# Patient Record
Sex: Female | Born: 1965 | Race: Asian | Hispanic: No | Marital: Married | Smoking: Never smoker
Health system: Southern US, Community
[De-identification: ages and names within clinical notes are randomized; demographics above are authoritative.]

## PROBLEM LIST (undated history)

## (undated) DIAGNOSIS — T7840XA Allergy, unspecified, initial encounter: Secondary | ICD-10-CM

## (undated) DIAGNOSIS — E079 Disorder of thyroid, unspecified: Secondary | ICD-10-CM

## (undated) HISTORY — PX: SMALL INTESTINE SURGERY: SHX150

## (undated) HISTORY — PX: BREAST SURGERY: SHX581

## (undated) HISTORY — DX: Disorder of thyroid, unspecified: E07.9

## (undated) HISTORY — DX: Allergy, unspecified, initial encounter: T78.40XA

## (undated) HISTORY — PX: CHOLECYSTECTOMY: SHX55

## (undated) HISTORY — PX: ABDOMINAL HYSTERECTOMY: SHX81

---

## 1999-03-31 ENCOUNTER — Other Ambulatory Visit: Admission: RE | Admit: 1999-03-31 | Discharge: 1999-03-31 | Payer: Self-pay | Admitting: Obstetrics and Gynecology

## 2012-06-15 ENCOUNTER — Other Ambulatory Visit: Payer: Self-pay | Admitting: Obstetrics and Gynecology

## 2012-06-15 DIAGNOSIS — Z803 Family history of malignant neoplasm of breast: Secondary | ICD-10-CM

## 2012-06-28 ENCOUNTER — Ambulatory Visit
Admission: RE | Admit: 2012-06-28 | Discharge: 2012-06-28 | Disposition: A | Discharge status: Home / Self Care | Payer: Managed Care, Other (non HMO) | Source: Ambulatory Visit | Attending: Obstetrics and Gynecology | Admitting: Obstetrics and Gynecology

## 2012-06-28 DIAGNOSIS — Z803 Family history of malignant neoplasm of breast: Secondary | ICD-10-CM

## 2012-06-28 MED ORDER — GADOBENATE DIMEGLUMINE 529 MG/ML IV SOLN
17.0000 mL | Freq: Once | INTRAVENOUS | Status: AC | PRN
  Administered 2012-06-28: 17 mL via INTRAVENOUS

## 2014-05-23 ENCOUNTER — Other Ambulatory Visit: Payer: Self-pay | Admitting: Obstetrics and Gynecology

## 2014-05-23 DIAGNOSIS — Z803 Family history of malignant neoplasm of breast: Secondary | ICD-10-CM

## 2014-07-15 ENCOUNTER — Other Ambulatory Visit: Payer: Managed Care, Other (non HMO)

## 2014-07-22 ENCOUNTER — Ambulatory Visit
Admission: RE | Admit: 2014-07-22 | Discharge: 2014-07-22 | Disposition: A | Discharge status: Home / Self Care | Payer: Managed Care, Other (non HMO) | Source: Ambulatory Visit | Attending: Obstetrics and Gynecology | Admitting: Obstetrics and Gynecology

## 2014-07-22 DIAGNOSIS — Z803 Family history of malignant neoplasm of breast: Secondary | ICD-10-CM

## 2014-07-22 MED ORDER — GADOBENATE DIMEGLUMINE 529 MG/ML IV SOLN
16.0000 mL | Freq: Once | INTRAVENOUS | Status: AC | PRN
  Administered 2014-07-22: 16 mL via INTRAVENOUS

## 2016-08-22 ENCOUNTER — Other Ambulatory Visit: Payer: Self-pay | Admitting: Obstetrics and Gynecology

## 2016-08-22 DIAGNOSIS — R928 Other abnormal and inconclusive findings on diagnostic imaging of breast: Secondary | ICD-10-CM

## 2016-08-30 ENCOUNTER — Ambulatory Visit
Admission: RE | Admit: 2016-08-30 | Discharge: 2016-08-30 | Disposition: A | Discharge status: Home / Self Care | Payer: Managed Care, Other (non HMO) | Source: Ambulatory Visit | Attending: Obstetrics and Gynecology | Admitting: Obstetrics and Gynecology

## 2016-08-30 ENCOUNTER — Other Ambulatory Visit: Payer: Self-pay | Admitting: Obstetrics and Gynecology

## 2016-08-30 DIAGNOSIS — Z803 Family history of malignant neoplasm of breast: Secondary | ICD-10-CM

## 2016-08-30 DIAGNOSIS — R928 Other abnormal and inconclusive findings on diagnostic imaging of breast: Secondary | ICD-10-CM

## 2016-09-07 ENCOUNTER — Other Ambulatory Visit: Payer: Managed Care, Other (non HMO)

## 2016-09-20 ENCOUNTER — Ambulatory Visit
Admission: RE | Admit: 2016-09-20 | Discharge: 2016-09-20 | Disposition: A | Discharge status: Home / Self Care | Payer: Managed Care, Other (non HMO) | Source: Ambulatory Visit | Attending: Obstetrics and Gynecology | Admitting: Obstetrics and Gynecology

## 2016-09-20 DIAGNOSIS — Z803 Family history of malignant neoplasm of breast: Secondary | ICD-10-CM

## 2016-09-20 MED ORDER — GADOBENATE DIMEGLUMINE 529 MG/ML IV SOLN
16.0000 mL | Freq: Once | INTRAVENOUS | Status: AC | PRN
  Administered 2016-09-20: 16 mL via INTRAVENOUS

## 2017-03-07 ENCOUNTER — Ambulatory Visit (HOSPITAL_BASED_OUTPATIENT_CLINIC_OR_DEPARTMENT_OTHER)
Admission: RE | Admit: 2017-03-07 | Discharge: 2017-03-07 | Disposition: A | Discharge status: Home / Self Care | Payer: Managed Care, Other (non HMO) | Source: Ambulatory Visit | Attending: Podiatry | Admitting: Podiatry

## 2017-03-07 ENCOUNTER — Encounter: Payer: Self-pay | Admitting: Podiatry

## 2017-03-07 ENCOUNTER — Ambulatory Visit (INDEPENDENT_AMBULATORY_CARE_PROVIDER_SITE_OTHER): Payer: Managed Care, Other (non HMO) | Admitting: Podiatry

## 2017-03-07 DIAGNOSIS — M67874 Other specified disorders of tendon, left ankle and foot: Secondary | ICD-10-CM | POA: Insufficient documentation

## 2017-03-07 DIAGNOSIS — M67873 Other specified disorders of tendon, right ankle and foot: Secondary | ICD-10-CM | POA: Diagnosis not present

## 2017-03-07 DIAGNOSIS — R52 Pain, unspecified: Secondary | ICD-10-CM

## 2017-03-07 DIAGNOSIS — M722 Plantar fascial fibromatosis: Secondary | ICD-10-CM | POA: Diagnosis not present

## 2017-03-07 MED ORDER — METHYLPREDNISOLONE 4 MG PO TBPK: ORAL_TABLET | ORAL | 0 refills | Status: DC

## 2017-03-07 MED ORDER — MELOXICAM 15 MG PO TABS: 15.0000 mg | ORAL_TABLET | Freq: Every day | ORAL | 2 refills | Status: AC

## 2017-03-07 NOTE — Patient Instructions (Signed)
You can start the medrol dose pack. Once this is complete you can start the mobic (meloxicam).   Stop by [employer] and look at new shoes.    Plantar Fasciitis Rehab Ask your health care provider which exercises are safe for you. Do exercises exactly as told by your health care provider and adjust them as directed. It is normal to feel mild stretching, pulling, tightness, or discomfort as you do these exercises, but you should stop right away if you feel sudden pain or your pain gets worse. Do not begin these exercises until told by your health care provider. Stretching and range of motion exercises These exercises warm up your muscles and joints and improve the movement and flexibility of your foot. These exercises also help to relieve pain. Exercise A: Plantar fascia stretch   1. Sit with your left / right leg crossed over your opposite knee. 2. Hold your heel with one hand with that thumb near your arch. With your other hand, hold your toes and gently pull them back toward the top of your foot. You should feel a stretch on the bottom of your toes or your foot or both. 3. Hold this stretch for__________ seconds. 4. Slowly release your toes and return to the starting position. Repeat __________ times. Complete this exercise __________ times a day. Exercise B: Gastroc, standing   1. Stand with your hands against a wall. 2. Extend your left / right leg behind you, and bend your front knee slightly. 3. Keeping your heels on the floor and keeping your back knee straight, shift your weight toward the wall without arching your back. You should feel a gentle stretch in your left / right calf. 4. Hold this position for __________ seconds. Repeat __________ times. Complete this exercise __________ times a day. Exercise C: Soleus, standing  1. Stand with your hands against a wall. 2. Extend your left / right leg behind you, and bend your front knee slightly. 3. Keeping your heels on the floor, bend  your back knee and slightly shift your weight over the back leg. You should feel a gentle stretch deep in your calf. 4. Hold this position for __________ seconds. Repeat __________ times. Complete this exercise __________ times a day. Exercise D: Gastrocsoleus, standing  1. Stand with the ball of your left / right foot on a step. The ball of your foot is on the walking surface, right under your toes. 2. Keep your other foot firmly on the same step. 3. Hold onto the wall or a railing for balance. 4. Slowly lift your other foot, allowing your body weight to press your heel down over the edge of the step. You should feel a stretch in your left / right calf. 5. Hold this position for __________ seconds. 6. Return both feet to the step. 7. Repeat this exercise with a slight bend in your left / right knee. Repeat __________ times with your left / right knee straight and __________ times with your left / right knee bent. Complete this exercise __________ times a day. Balance exercise This exercise builds your balance and strength control of your arch to help take pressure off your plantar fascia. Exercise E: Single leg stand  1. Without shoes, stand near a railing or in a doorway. You may hold onto the railing or door frame as needed. 2. Stand on your left / right foot. Keep your big toe down on the floor and try to keep your arch lifted. Do not let  your foot roll inward. 3. Hold this position for __________ seconds. 4. If this exercise is too easy, you can try it with your eyes closed or while standing on a pillow. Repeat __________ times. Complete this exercise __________ times a day. This information is not intended to replace advice given to you by your health care provider. Make sure you discuss any questions you have with your health care provider. Document Released: 10/24/2005 Document Revised: 06/28/2016 Document Reviewed: 09/07/2015 Elsevier Interactive Patient Education  2017 [employer].

## 2017-03-07 NOTE — Progress Notes (Signed)
   Subjective:    Patient ID: Aimee Daniels, female    DOB: [birth year], 51 y.o.   MRN: [id number]  HPI  51 year old female presents the office with concerns of bilateral heel pain which is been ongoing for about 1 year. She states that she also has fallen arches. She denies any recent injury or trauma. The pain does not wake up at night. No numbness or tingling. She is in the recent treatment for. She gets pain she's been sitting for some time she stands back up. She has no other complaints today.    Review of Systems  All other systems reviewed and are negative.      Objective:   Physical Exam General: AAO x3, NAD  Dermatological: Skin is warm, dry and supple bilateral. Nails x 10 are well manicured; remaining integument appears unremarkable at this time. There are no open sores, no preulcerative lesions, no rash or signs of infection present.  Vascular: Dorsalis Pedis artery and Posterior Tibial artery pedal pulses are 2/4 bilateral with immedate capillary fill time. Pedal hair growth present. No varicosities and no lower extremity edema present bilateral. There is no pain with calf compression, swelling, warmth, erythema.   Neruologic: Grossly intact via light touch bilateral. Vibratory intact via tuning fork bilateral. Protective threshold with Semmes Wienstein monofilament intact to all pedal sites bilateral.   Musculoskeletal: Tenderness to palpation along the plantar medial tubercle of the calcaneus at the insertion of plantar fascia on the left and right plantar foot. There is no pain along the course of the plantar fascia within the arch of the foot. Plantar fascia appears to be intact. There is no pain with lateral compression of the calcaneus or pain with vibratory sensation. There is no pain along the course or insertion of the achilles tendon. No other areas of tenderness to bilateral lower extremities. Muscular strength 5/5 in all groups tested bilateral. Decrease in medial  arch height. Equinus  Gait: Unassisted, Nonantalgic.      Assessment & Plan:  51 year old female bilateral heel pain, likely plantar fasciitis -Treatment options discussed including all alternatives, risks, and complications -Etiology of symptoms were discussed .mwxry -Declined steroid injection -Night splint -Plantar fascial braces dispensed -Long-term recommended orthotics. Also discussed shoe gear modifications. -Stretching icing daily. -RTC 3-4 weeks or sooner if needed.  Ovid Curd, DPM

## 2017-03-28 ENCOUNTER — Ambulatory Visit (INDEPENDENT_AMBULATORY_CARE_PROVIDER_SITE_OTHER): Payer: Managed Care, Other (non HMO) | Admitting: Podiatry

## 2017-03-28 DIAGNOSIS — M722 Plantar fascial fibromatosis: Secondary | ICD-10-CM | POA: Diagnosis not present

## 2017-03-28 NOTE — Progress Notes (Signed)
Subjective: Aimee Daniels presents to the office today for follow-up evaluation of bilateral heel pain, plantar fasciitis. They state that they are doing much better. She is having minimal to no pain. They have been icing, stretching, try to wear supportive shoe as much as possible. She has been wearing the plantar fascial braces. She did purchase new shoes was been helping a lot. No other complaints at this time. No acute changes since last appointment. They deny any systemic complaints such as fevers, chills, nausea, vomiting.  Objective: General: AAO x3, NAD  Dermatological: here are no open sores, no preulcerative lesions, no rash or signs of infection present.  Vascular: Dorsalis Pedis artery and Posterior Tibial artery pedal pulses are 2/4 bilateral with immedate capillary fill time. Pedal hair growth present. There is no pain with calf compression, swelling, warmth, erythema.   Neruologic: Grossly intact via light touch bilateral.   Musculoskeletal: There is minimal and much improved tenderness palpation along the plantar medial tubercle of the calcaneus at the insertion of the plantar fascia on the left and right foot. There is no pain along the course of the plantar fascia within the arch of the foot. Plantar fascia appears to be intact bilaterally. There is no pain with lateral compression of the calcaneus and there is no pain with vibratory sensation. There is no pain along the course or insertion of the Achilles tendon. There are no other areas of tenderness to bilateral lower extremities. No gross boney pedal deformities bilateral. No pain, crepitus, or limitation noted with foot and ankle range of motion bilateral. Muscular strength 5/5 in all groups tested bilateral.  Gait: Unassisted, Nonantalgic.   Assessment: Presents for follow-up evaluation for heel pain, likely plantar fasciitis which is much improved.   Plan: -Treatment options discussed including all alternatives,  risks, and complications -Continue with ice and stretching exercises on a daily basis. -She did purchase new shoes this been helping a lot. Discussed with her she can try to wean off when the brace is admitted. She gets some recurrent pain recommended inserts for her shoes. We'll start with an over-the-counter insert. -Continue supportive shoe gear. -Follow-up if symptoms are not completely resolved in the next 4-6 weeks or sooner if any problems arise. In the meantime, encouraged to call the office with any questions, concerns, change in symptoms.   Ovid CurdMatthew Wagoner, DPM

## 2017-03-28 NOTE — Patient Instructions (Signed)
Look at a Powerstep or a Superfeet insert for your shoes if needed.

## 2017-05-16 ENCOUNTER — Encounter: Payer: Self-pay | Admitting: Podiatry

## 2017-05-16 ENCOUNTER — Ambulatory Visit (INDEPENDENT_AMBULATORY_CARE_PROVIDER_SITE_OTHER): Payer: Managed Care, Other (non HMO) | Admitting: Podiatry

## 2017-05-16 ENCOUNTER — Encounter (INDEPENDENT_AMBULATORY_CARE_PROVIDER_SITE_OTHER): Payer: Self-pay

## 2017-05-16 DIAGNOSIS — M722 Plantar fascial fibromatosis: Secondary | ICD-10-CM | POA: Diagnosis not present

## 2017-05-16 MED ORDER — METHYLPREDNISOLONE 4 MG PO TBPK: ORAL_TABLET | ORAL | 0 refills | Status: DC

## 2017-05-16 MED ORDER — TRIAMCINOLONE ACETONIDE 10 MG/ML IJ SUSP: 10.0000 mg | Freq: Once | INTRAMUSCULAR | Status: AC

## 2017-05-17 NOTE — Progress Notes (Signed)
Subjective: Aimee Daniels presents to the office today for follow-up evaluation of bilateral heel pain. They state that they are doing better but she states that she is still having pain. Today she states that she would have injections to both of her feet. She's been doing more walking while taking her son in college sores as well as moving which may be aggravated her feet. They have been icing, stretching, try to wear supportive shoe as much as possible. No other complaints at this time. No acute changes since last appointment. They deny any systemic complaints such as fevers, chills, nausea, vomiting.  Objective: General: AAO x3, NAD  Dermatological: Skin is warm, dry and supple bilateral. Nails x 10 are well manicured; remaining integument appears unremarkable at this time. There are no open sores, no preulcerative lesions, no rash or signs of infection present.  Vascular: Dorsalis Pedis artery and Posterior Tibial artery pedal pulses are 2/4 bilateral with immedate capillary fill time. Pedal hair growth present. There is no pain with calf compression, swelling, warmth, erythema.   Neruologic: Grossly intact via light touch bilateral. Vibratory intact via tuning fork bilateral. Protective threshold with Semmes Wienstein monofilament intact to all pedal sites bilateral.   Musculoskeletal: There is continued tenderness palpation along the plantar medial tubercle of the calcaneus at the insertion of the plantar fascia on the left >> right foot. There is no pain along the course of the plantar fascia within the arch of the foot. Plantar fascia appears to be intact bilaterally. There is no pain with lateral compression of the calcaneus and there is no pain with vibratory sensation. There is no pain along the course or insertion of the Achilles tendon. There are no other areas of tenderness to bilateral lower extremities. No gross boney pedal deformities bilateral. No pain, crepitus, or limitation noted  with foot and ankle range of motion bilateral. Muscular strength 5/5 in all groups tested bilateral.  Gait: Unassisted, Nonantalgic.   Assessment: Presents for follow-up evaluation for heel pain, likely plantar fasciitis   Plan: -Treatment options discussed including all alternatives, risks, and complications -Patient elects to proceed with steroid injection into the left and right heel. Under sterile skin preparation, a total of 2.5cc of kenalog 10, 0.5% Marcaine plain, and 2% lidocaine plain were infiltrated into the symptomatic area without complication. A band-aid was applied. Patient tolerated the injection well without complication. Post-injection care with discussed with the patient. Discussed with the patient to ice the area over the next couple of days to help prevent a steroid flare.  -Continue plantar fascial brace. -Ice and stretching exercises on a daily basis. -Continue supportive shoe gear. Discussed orthotics. -Follow-up in 3-4 weeks or sooner if any problems arise. In the meantime, encouraged to call the office with any questions, concerns, change in symptoms.   Ovid CurdMatthew Loretta Doutt, DPM

## 2017-05-18 DIAGNOSIS — M722 Plantar fascial fibromatosis: Secondary | ICD-10-CM | POA: Insufficient documentation

## 2017-06-20 ENCOUNTER — Ambulatory Visit: Payer: Managed Care, Other (non HMO) | Admitting: Podiatry

## 2017-07-13 ENCOUNTER — Encounter: Payer: Self-pay | Admitting: Family Medicine

## 2017-07-13 ENCOUNTER — Ambulatory Visit (INDEPENDENT_AMBULATORY_CARE_PROVIDER_SITE_OTHER): Payer: Managed Care, Other (non HMO) | Admitting: Family Medicine

## 2017-07-13 VITALS — BP 110/74 | HR 86 | Temp 98.0°F | Resp 20 | Ht 63.47 in | Wt 214.2 lb

## 2017-07-13 DIAGNOSIS — H6981 Other specified disorders of Eustachian tube, right ear: Secondary | ICD-10-CM

## 2017-07-13 DIAGNOSIS — Z23 Encounter for immunization: Secondary | ICD-10-CM | POA: Diagnosis not present

## 2017-07-13 MED ORDER — MECLIZINE HCL 25 MG PO TABS: 25.0000 mg | ORAL_TABLET | Freq: Three times a day (TID) | ORAL | 0 refills | Status: DC | PRN

## 2017-07-13 MED ORDER — FLUTICASONE PROPIONATE 50 MCG/ACT NA SUSP: 2.0000 | Freq: Every day | NASAL | 6 refills | Status: AC

## 2017-07-13 NOTE — Patient Instructions (Addendum)
   IF you received an x-ray today, you will receive an invoice from [facility]. Please contact [facility] at [phone] with questions or concerns regarding your invoice.   IF you received labwork today, you will receive an invoice from [facility]. Please contact [facility] at [phone] with questions or concerns regarding your invoice.   Our billing staff will not be able to assist you with questions regarding bills from these companies.  You will be contacted with the lab results as soon as they are available. The fastest way to get your results is to activate your My Chart account. Instructions are located on the last page of this paperwork. If you have not heard from us regarding the results in 2 weeks, please contact this office.     Eustachian Tube Dysfunction The eustachian tube connects the middle ear to the back of the nose. It regulates air pressure in the middle ear by allowing air to move between the ear and nose. It also helps to drain fluid from the middle ear space. When the eustachian tube does not function properly, air pressure, fluid, or both can build up in the middle ear. Eustachian tube dysfunction can affect one or both ears. What are the causes? This condition happens when the eustachian tube becomes blocked or cannot open normally. This may result from:  Ear infections.  Colds and other upper respiratory infections.  Allergies.  Irritation, such as from cigarette smoke or acid from the stomach coming up into the esophagus (gastroesophageal reflux).  Sudden changes in air pressure, such as from descending in an airplane.  Abnormal growths in the nose or throat, such as nasal polyps, tumors, or enlarged tissue at the back of the throat (adenoids).  What increases the risk? This condition may be more likely to develop in people who smoke and people who are overweight. Eustachian tube dysfunction may also be more likely to develop in  children, especially children who have:  Certain birth defects of the mouth, such as cleft palate.  Large tonsils and adenoids.  What are the signs or symptoms? Symptoms of this condition may include:  A feeling of fullness in the ear.  Ear pain.  Clicking or popping noises in the ear.  Ringing in the ear.  Hearing loss.  Loss of balance.  Symptoms may get worse when the air pressure around you changes, such as when you travel to an area of high elevation or fly on an airplane. How is this diagnosed? This condition may be diagnosed based on:  Your symptoms.  A physical exam of your ear, nose, and throat.  Tests, such as those that measure: ? The movement of your eardrum (tympanogram). ? Your hearing (audiometry).  How is this treated? Treatment depends on the cause and severity of your condition. If your symptoms are mild, you may be able to relieve your symptoms by moving air into ("popping") your ears. If you have symptoms of fluid in your ears, treatment may include:  Decongestants.  Antihistamines.  Nasal sprays or ear drops that contain medicines that reduce swelling (steroids).  In some cases, you may need to have a procedure to drain the fluid in your eardrum (myringotomy). In this procedure, a small tube is placed in the eardrum to:  Drain the fluid.  Restore the air in the middle ear space.  Follow these instructions at home:  Take over-the-counter and prescription medicines only as told by your health care provider.  Use techniques to help pop   your ears as recommended by your health care provider. These may include: ? Chewing gum. ? Yawning. ? Frequent, forceful swallowing. ? Closing your mouth, holding your nose closed, and gently blowing as if you are trying to blow air out of your nose.  Do not do any of the following until your health care provider approves: ? Travel to high altitudes. ? Fly in airplanes. ? Work in a pressurized cabin or  room. ? Scuba dive.  Keep your ears dry. Dry your ears completely after showering or bathing.  Do not smoke.  Keep all follow-up visits as told by your health care provider. This is important. Contact a health care provider if:  Your symptoms do not go away after treatment.  Your symptoms come back after treatment.  You are unable to pop your ears.  You have: ? A fever. ? Pain in your ear. ? Pain in your head or neck. ? Fluid draining from your ear.  Your hearing suddenly changes.  You become very dizzy.  You lose your balance. This information is not intended to replace advice given to you by your health care provider. Make sure you discuss any questions you have with your health care provider. Document Released: 11/20/2015 Document Revised: 03/31/2016 Document Reviewed: 11/12/2014 Elsevier Interactive Patient Education  2018 [employer]  

## 2017-07-13 NOTE — Progress Notes (Signed)
9/6/20185:41 PM  Aimee Daniels 05/29/1966, 51 y.o. female [id number]  Chief Complaint  Patient presents with  . Dizziness    X 1 week  . Ear Fullness    X 1 day    HPI:   Patient is a 51 y.o. female who presents today for about a week of progressing sense of imbalance, specially when she stands up or moves head too much. Associated with right ear fullness. Her husband states that she has been snoring this past week. She does not feel congested though endorses mild PND and tickle in back of throat. She has never had anything similar. She has been using cough drops an chewing gum. She denies any recurring ear or sinus infections/surgeries.  Depression screen PHQ 2/9 07/13/2017  Decreased Interest 0  Down, Depressed, Hopeless 0  PHQ - 2 Score 0    No Known Allergies  Current Meds  Medication Sig  . Cholecalciferol (VITAMIN D3 PO) Take by mouth.  . levothyroxine (SYNTHROID, LEVOTHROID) 137 MCG tablet Take by mouth daily before breakfast.   . liothyronine (CYTOMEL) 5 MCG tablet Take 5 mcg by mouth daily.  . meloxicam (MOBIC) 15 MG tablet Take 1 tablet (15 mg total) by mouth daily.  . temazepam (RESTORIL) 30 MG capsule TAKE ONE CAPSULE BY MOUTH ONCE DAILY AT BEDTIME AS NEEDED FOR SLEEP  . zolpidem (AMBIEN) 10 MG tablet TAKE ONE TABLET BY MOUTH ONCE DAILY AT BEDTIME AS NEEDED FOR SLEEP   Current Facility-Administered Medications for the 07/13/17 encounter (Office Visit) with Aimee LippsSantiago, Aimee Daniels, Aimee Daniels  Medication  . triamcinolone acetonide (KENALOG) 10 MG/ML injection 10 mg  kenalog in error, given by another provider in 05/2017  Past Medical History:  Diagnosis Date  . Allergy   . Thyroid disease     Past Surgical History:  Procedure Laterality Date  . ABDOMINAL HYSTERECTOMY    . BREAST SURGERY    . CESAREAN SECTION    . CHOLECYSTECTOMY      Social History  Substance Use Topics  . Smoking status: Never Smoker  . Smokeless tobacco: Never Used  . Alcohol use Yes   Comment: occ    Family History  Problem Relation Age of Onset  . Diabetes Mother   . Hyperlipidemia Mother   . Hypertension Father     Review of Systems  Constitutional: Negative for chills and fever.  HENT: Negative for congestion, ear discharge, ear pain, hearing loss, sinus pain, sore throat and tinnitus.   Respiratory: Negative for cough and shortness of breath.   Gastrointestinal: Positive for nausea. Negative for vomiting.  Neurological: Positive for dizziness. Negative for sensory change, speech change, focal weakness and headaches.     OBJECTIVE:  Blood pressure 110/74, pulse 86, temperature 98 F (36.7 C), temperature source Oral, resp. rate 20, height 5' 3.47" (1.612 Daniels), weight 214 lb 3.2 oz (97.2 kg), SpO2 96 %.  Physical Exam  Constitutional: She is oriented to person, place, and time and well-developed, well-nourished, and in no distress.  HENT:  Head: Normocephalic and atraumatic.  Right Ear: Hearing, tympanic membrane, external ear and ear canal normal.  Left Ear: Hearing, tympanic membrane, external ear and ear canal normal.  Nose: Mucosal edema present.  Mouth/Throat: Oropharynx is clear and moist.  Eyes: Pupils are equal, round, and reactive to light. EOM are normal.  Neck: Neck supple.  Cardiovascular: Normal rate, regular rhythm and normal heart sounds.  Exam reveals no gallop and no friction rub.   No murmur heard.  Pulmonary/Chest: Effort normal and breath sounds normal. She has no wheezes. She has no rales.  Lymphadenopathy:    She has no cervical adenopathy.  Neurological: She is alert and oriented to person, place, and time. She displays no weakness and normal speech. No cranial nerve deficit or sensory deficit. Gait normal.  Skin: Skin is warm and dry.    ASSESSMENT and PLAN:  1. Eustachian tube dysfunction, right Discussed diagnosis and treatment recommendations, Rx flonase and meclizine. Patient educational handout given. RTC precautions  given.  2. Need for immunization against influenza Given today - Flu Vaccine QUAD 36+ mos IM       Aimee Daniels, [facility] [address] [place], [place] [address] Ph.  [phone] Fax [phone]

## 2017-11-13 ENCOUNTER — Encounter: Payer: Self-pay | Admitting: Podiatry

## 2017-11-13 ENCOUNTER — Ambulatory Visit (INDEPENDENT_AMBULATORY_CARE_PROVIDER_SITE_OTHER): Payer: Managed Care, Other (non HMO) | Admitting: Podiatry

## 2017-11-13 DIAGNOSIS — M722 Plantar fascial fibromatosis: Secondary | ICD-10-CM | POA: Diagnosis not present

## 2017-11-13 MED ORDER — MELOXICAM 15 MG PO TABS: 15.0000 mg | ORAL_TABLET | Freq: Every day | ORAL | 0 refills | Status: AC

## 2017-11-13 NOTE — Patient Instructions (Signed)

## 2017-11-14 NOTE — Progress Notes (Signed)
Subjective: 52 year old female presents the office today for concerns and recurrent pain to bilateral heel pain, plantar fasciitis.  She presents with her husband.  She states that after the injection she was doing much better however over the last couple weeks she started to notice some recurrence.  She has been using a plantar fascial braces as well as she has purchased some good over-the-counter inserts which have been area she also has been stretching icing as much as she can.  She is also starting a new job since I last saw her she is doing more walking.  Denies any recent injury or trauma.  The pain does not wake her up at night.  She has no new concerns. Denies any systemic complaints such as fevers, chills, nausea, vomiting. No acute changes since last appointment, and no other complaints at this time.   Objective: AAO x3, NAD DP/PT pulses palpable bilaterally, CRT less than 3 seconds There is improved but still mild tenderness to palpation along the plantar medial tubercle of the calcaneus at the insertion of plantar fascia on the left and right foot. There is no pain along the course of the plantar fascia within the arch of the foot. Plantar fascia appears to be intact. There is no pain with lateral compression of the calcaneus or pain with vibratory sensation. There is no pain along the course or insertion of the achilles tendon. No other areas of tenderness to bilateral lower extremities. Negative tinel sign bilaterally.  No open lesions or pre-ulcerative lesions.  No pain with calf compression, swelling, warmth, erythema  Assessment: Bilateral heel pain, likely plantar fasciitis right  Plan: -All treatment options discussed with the patient including all alternatives, risks, complications.  -At today's appointment she is requesting steroid injections to bilateral heels.  See procedure note below.  She tolerated this well without any complications. -Continue with stretching, icing  exercises daily. -The inserts are fitting well and I want her to continue with this as well as the plantar fascial braces. -Discussed other treatment options with her today including physical therapy, EPAT but wishes to hold off on this today.  -RTC 3-4 weeks or sooner if needed. Agrees to this plan and has no further questions.  -Patient encouraged to call the office with any questions, concerns, change in symptoms.   Procedure: Injection Tendon/Ligament Discussed alternatives, risks, complications and verbal consent was obtained.  Location: Bilateral plantar fascia at the glabrous junction; medial approach. Skin Prep: Alcohol. Injectate: 1 cc 0.5% marcaine plain, 1 cc 0.5% Marcaine plain and, 1 cc kenalog 10. Disposition: Patient tolerated procedure well. Injection site dressed with a band-aid.  Post-injection care was discussed and return precautions discussed.    Vivi BarrackMatthew R Jelisa Kerr DPM

## 2017-12-04 ENCOUNTER — Ambulatory Visit: Payer: Managed Care, Other (non HMO) | Admitting: Podiatry

## 2018-08-28 ENCOUNTER — Other Ambulatory Visit: Payer: Self-pay

## 2018-08-28 ENCOUNTER — Ambulatory Visit (INDEPENDENT_AMBULATORY_CARE_PROVIDER_SITE_OTHER): Payer: Managed Care, Other (non HMO) | Admitting: Physician Assistant

## 2018-08-28 ENCOUNTER — Encounter: Payer: Self-pay | Admitting: Physician Assistant

## 2018-08-28 VITALS — BP 126/85 | HR 71 | Temp 98.9°F | Resp 16 | Ht 62.0 in | Wt 223.2 lb

## 2018-08-28 DIAGNOSIS — H608X3 Other otitis externa, bilateral: Secondary | ICD-10-CM

## 2018-08-28 DIAGNOSIS — J302 Other seasonal allergic rhinitis: Secondary | ICD-10-CM

## 2018-08-28 MED ORDER — HYDROCORTISONE-ACETIC ACID 1-2 % OT SOLN: 3.0000 [drp] | Freq: Two times a day (BID) | OTIC | 0 refills | Status: AC

## 2018-08-28 NOTE — Progress Notes (Signed)
Aimee Daniels  MRN: [id number] DOB: 06/01/1966  PCP: Marvis Repress, MD  Subjective:  Pt is a 52 year old female who presents to clinic for b/l ear pain x 1 week.  Pain R>L. Feel clogged.  She used her son's ear drops - didn't help much.  Denies balance problems, ringing in ears.   Review of Systems  Constitutional: Negative for chills, diaphoresis, fatigue and fever.  HENT: Positive for ear pain. Negative for congestion, postnasal drip, rhinorrhea, sinus pressure, sinus pain and sore throat.   Respiratory: Negative for cough, shortness of breath and wheezing.   Psychiatric/Behavioral: Negative for sleep disturbance.   Patient Active Problem List   Diagnosis Date Noted  . Plantar fasciitis 05/18/2017    Current Outpatient Medications on File Prior to Visit  Medication Sig Dispense Refill  . Cholecalciferol (VITAMIN D3 PO) Take by mouth.    . fluticasone (FLONASE) 50 MCG/ACT nasal spray Place 2 sprays into both nostrils daily. 16 g 6  . levothyroxine (SYNTHROID, LEVOTHROID) 137 MCG tablet Take by mouth daily before breakfast.     . liothyronine (CYTOMEL) 5 MCG tablet Take 5 mcg by mouth daily.    . meclizine (ANTIVERT) 25 MG tablet Take 1 tablet (25 mg total) by mouth 3 (three) times daily as needed for dizziness. 30 tablet 0  . meloxicam (MOBIC) 15 MG tablet Take 1 tablet (15 mg total) by mouth daily. 30 tablet 0  . temazepam (RESTORIL) 30 MG capsule TAKE ONE CAPSULE BY MOUTH ONCE DAILY AT BEDTIME AS NEEDED FOR SLEEP    . zolpidem (AMBIEN) 10 MG tablet TAKE ONE TABLET BY MOUTH ONCE DAILY AT BEDTIME AS NEEDED FOR SLEEP     Current Facility-Administered Medications on File Prior to Visit  Medication Dose Route Frequency Provider Last Rate Last Dose  . triamcinolone acetonide (KENALOG) 10 MG/ML injection 10 mg  10 mg Other Once Vivi Barrack, DPM        No Known Allergies   Objective:  BP 126/85 (BP Location: Right Arm, Patient Position: Sitting, Cuff Size:  Large)   Pulse 71   Temp 98.9 F (37.2 C) (Oral)   Resp 16   Ht 5\' 2"  (1.575 m)   Wt 223 lb 3.2 oz (101.2 kg)   SpO2 100%   BMI 40.82 kg/m   Physical Exam  Constitutional: She is oriented to person, place, and time. No distress.  HENT:  Right Ear: Hearing, tympanic membrane and ear canal normal. Tympanic membrane is not erythematous.  Left Ear: Hearing, tympanic membrane and ear canal normal. Tympanic membrane is not erythematous.  Nose: Mucosal edema present. No rhinorrhea. Right sinus exhibits no maxillary sinus tenderness and no frontal sinus tenderness. Left sinus exhibits no maxillary sinus tenderness and no frontal sinus tenderness.  Mouth/Throat: Oropharynx is clear and moist and mucous membranes are normal.  Cardiovascular: Normal rate, regular rhythm and normal heart sounds.  Neurological: She is alert and oriented to person, place, and time.  Skin: Skin is warm and dry.  Psychiatric: Judgment normal.  Vitals reviewed.   Assessment and Plan :  1. Noninfectious otitis externa of both ears, unspecified chronicity, unspecified type 2. Seasonal allergies - Pt presents c/o external ear pain and itching. PE is unremarkable. No red flags. Plan to treat for mild external otitis possibly 2/2 allergies. Start flonase daily.  RTC if no improvement.  - acetic acid-hydrocortisone (VOSOL-HC) OTIC solution; Place 3 drops into both ears 2 (two) times daily for 5 days.  Dispense: 10 mL; Refill: 0   Whitney Starsky Nanna, PA-C  Primary Care at [facility] 08/28/2018 4:37 PM  Please note: Portions of this report may have been transcribed using dragon voice recognition software. Every effort was made to ensure accuracy; however, inadvertent computerized transcription errors may be present.

## 2018-08-28 NOTE — Patient Instructions (Addendum)
   Start using flonase 2 sprays each nostril daily.   Apply 3-5 drops into your ears for the next 5 days.   Stay well hydrated.   Try advil if you need pain relief.   If you have lab work done today you will be contacted with your lab results within the next 2 weeks.  If you have not heard from [place] then please contact us. The fastest way to get your results is to register for My Chart.  Thank you for coming in today. I hope you feel we met your needs.  Feel free to call PCP if you have any questions or further requests.  Please consider signing up for MyChart if you do not already have it, as this is a great way to communicate with me.  Best,  Whitney McVey, PA-C  IF you received an x-ray today, you will receive an invoice from [facility]. Please contact [facility] at [phone] with questions or concerns regarding your invoice.   IF you received labwork today, you will receive an invoice from [facility]. Please contact [facility] at [phone] with questions or concerns regarding your invoice.   Our billing staff will not be able to assist you with questions regarding bills from these companies.  You will be contacted with the lab results as soon as they are available. The fastest way to get your results is to activate your My Chart account. Instructions are located on the last page of this paperwork. If you have not heard from [place] regarding the results in 2 weeks, please contact this office.

## 2018-08-29 ENCOUNTER — Other Ambulatory Visit: Payer: Self-pay | Admitting: Physician Assistant

## 2018-08-29 DIAGNOSIS — H608X3 Other otitis externa, bilateral: Secondary | ICD-10-CM

## 2018-08-29 MED ORDER — ACETIC ACID 2 % OT SOLN: 4.0000 [drp] | OTIC | 0 refills | Status: AC

## 2018-08-30 ENCOUNTER — Telehealth: Payer: Self-pay | Admitting: Physician Assistant

## 2018-08-30 NOTE — Telephone Encounter (Signed)
Copied from CRM [id number]. Topic: General - Other >> Aug 29, 2018  9:34 AM Tamela Oddi wrote: Reason for CRM: Patient called to inform the doctor that the prescription for acetic acid-hydrocortisone for her ears, is no longer available according to the pharmacy.  Patient would like an alternative medication sent into the pharmacy as soon as possible for her pain.  Please advise.  CB# [phone] >> Aug 29, 2018  3:51 PM Gerrianne Scale wrote: Pt calling back about a RX for ear drops that no longer available she would like a call back today  She uses the [facility] in [place] her number is [phone] pt states that her ear is really hurting and if someone could fill this today

## 2018-08-30 NOTE — Telephone Encounter (Signed)
Routing to PA McVey as she is the one who saw the patient for this.

## 2018-08-30 NOTE — Telephone Encounter (Signed)
Spoke to pharmacist at [facility] and [facility] No acetic acid drops available any longer. Per both pharmacists The only treatment without ABX is the OTC drops for swimmers' ear.  Discussed with McVey.  She will call pt.

## 2018-09-18 MED FILL — SERTRALINE HCL 50 MG TABLET: 50 | 30 days supply | Qty: 30 | Fill #0

## 2018-10-01 MED FILL — LIOTHYRONINE SODIUM 5 MCG T: 5 | 30 days supply | Qty: 30 | Fill #0

## 2018-10-01 MED FILL — SYNTHROID 150 MCG TABLET: 150 | 30 days supply | Qty: 30 | Fill #0

## 2018-10-16 MED FILL — MELOXICAM 15 MG TABLET: 15 | 30 days supply | Qty: 30 | Fill #0

## 2018-10-16 MED FILL — ZOLPIDEM TARTRATE 10 MG TAB: 10 | 30 days supply | Qty: 30 | Fill #0

## 2018-10-16 MED FILL — TEMAZEPAM 15 MG CAPSULE: 15 | 30 days supply | Qty: 60 | Fill #0

## 2018-11-14 MED FILL — MELOXICAM 15 MG TABLET: 15 | 30 days supply | Qty: 30 | Fill #1

## 2018-11-15 MED FILL — TEMAZEPAM 15 MG CAPSULE: 15 | 30 days supply | Qty: 60 | Fill #1

## 2018-11-15 MED FILL — ZOLPIDEM TARTRATE 10 MG TAB: 10 | 30 days supply | Qty: 30 | Fill #1

## 2018-11-20 MED FILL — GABAPENTIN 300 MG CAPSULE: 300 | 6 days supply | Qty: 18 | Fill #0

## 2018-11-20 MED FILL — METHYLPREDNISOLONE 4 MG TAB: 4 | 6 days supply | Qty: 21 | Fill #0

## 2018-11-28 MED FILL — SYNTHROID 150 MCG TABLET: 150 | 30 days supply | Qty: 30 | Fill #1

## 2018-11-28 MED FILL — LIOTHYRONINE SODIUM 5 MCG T: 5 | 30 days supply | Qty: 30 | Fill #1

## 2018-12-18 MED FILL — TEMAZEPAM 15 MG CAPSULE: 15 | 30 days supply | Qty: 60 | Fill #2

## 2018-12-18 MED FILL — ZOLPIDEM TARTRATE 10 MG TAB: 10 | 30 days supply | Qty: 30 | Fill #2

## 2019-01-02 MED FILL — LIOTHYRONINE SODIUM 5 MCG T: 5 | 30 days supply | Qty: 30 | Fill #2

## 2019-01-02 MED FILL — SYNTHROID 150 MCG TABLET: 150 | 30 days supply | Qty: 30 | Fill #2

## 2019-01-02 MED FILL — MELOXICAM 15 MG TABLET: 15 | 30 days supply | Qty: 30 | Fill #2

## 2019-01-11 MED FILL — VENLAFAXINE HCL ER 37.5 MG: 37.5 | 30 days supply | Qty: 60 | Fill #0

## 2019-01-14 MED FILL — ZOLPIDEM TARTRATE 10 MG TAB: 10 | 30 days supply | Qty: 30 | Fill #3

## 2019-01-14 MED FILL — TEMAZEPAM 15 MG CAPSULE: 15 | 30 days supply | Qty: 60 | Fill #3

## 2019-01-28 MED FILL — LIOTHYRONINE SODIUM 5 MCG T: 5 | 30 days supply | Qty: 30 | Fill #3

## 2019-01-28 MED FILL — MELOXICAM 15 MG TABLET: 15 | 30 days supply | Qty: 30 | Fill #3

## 2019-01-28 MED FILL — SYNTHROID 150 MCG TABLET: 150 | 30 days supply | Qty: 30 | Fill #3

## 2019-02-14 MED FILL — TEMAZEPAM 30 MG CAPSULE: 30 | 30 days supply | Qty: 30 | Fill #0

## 2019-02-14 MED FILL — ZOLPIDEM TARTRATE 10 MG TAB: 10 | 30 days supply | Qty: 30 | Fill #0

## 2019-03-08 MED FILL — VENLAFAXINE HCL ER 37.5 MG: 37.5 | 30 days supply | Qty: 30 | Fill #0

## 2019-03-13 MED FILL — SYNTHROID 150 MCG TABLET: 150 | 30 days supply | Qty: 30 | Fill #4

## 2019-03-13 MED FILL — ZOLPIDEM TARTRATE 10 MG TAB: 10 | 30 days supply | Qty: 30 | Fill #1

## 2019-03-13 MED FILL — LIOTHYRONINE SODIUM 5 MCG T: 5 | 30 days supply | Qty: 30 | Fill #4

## 2019-03-20 MED FILL — TEMAZEPAM 30 MG CAPSULE: 30 | 30 days supply | Qty: 30 | Fill #0

## 2019-04-17 MED FILL — ZOLPIDEM TARTRATE 10 MG TAB: 10 | 30 days supply | Qty: 30 | Fill #0

## 2019-04-17 MED FILL — TEMAZEPAM 30 MG CAPSULE: 30 | 30 days supply | Qty: 30 | Fill #1

## 2019-05-09 MED FILL — SYNTHROID 150 MCG TABLET: 150 | 30 days supply | Qty: 30 | Fill #5

## 2019-05-09 MED FILL — LIOTHYRONINE SODIUM 5 MCG T: 5 | 30 days supply | Qty: 30 | Fill #5

## 2019-05-17 MED FILL — TEMAZEPAM 30 MG CAPSULE: 30 | 60 days supply | Qty: 30 | Fill #0

## 2019-05-17 MED FILL — ZOLPIDEM TARTRATE 10 MG TAB: 10 | 30 days supply | Qty: 30 | Fill #0

## 2019-06-14 MED FILL — SYNTHROID 150 MCG TABLET: 150 | 30 days supply | Qty: 30 | Fill #6

## 2019-06-14 MED FILL — LIOTHYRONINE SODIUM 5 MCG T: 5 | 30 days supply | Qty: 30 | Fill #6

## 2019-06-15 MED FILL — ZOLPIDEM TARTRATE 10 MG TAB: 10 | 30 days supply | Qty: 30 | Fill #1

## 2019-06-15 MED FILL — TEMAZEPAM 30 MG CAPSULE: 30 | 30 days supply | Qty: 30 | Fill #1

## 2019-07-17 MED FILL — LIOTHYRONINE SODIUM 5 MCG T: 5 | 30 days supply | Qty: 30 | Fill #0

## 2019-07-17 MED FILL — SYNTHROID 150 MCG TABLET: 150 | 30 days supply | Qty: 30 | Fill #0

## 2019-07-17 MED FILL — ZOLPIDEM TARTRATE 10 MG TAB: 10 | 30 days supply | Qty: 30 | Fill #2

## 2019-07-17 MED FILL — TEMAZEPAM 30 MG CAPSULE: 30 | 30 days supply | Qty: 30 | Fill #2

## 2019-07-31 MED FILL — SHINGRIX 50 MCG SUS: 50 | 1 days supply | Qty: 1 | Fill #0

## 2019-08-09 MED FILL — INTRAROSA 6.5 MG VAG INSERT: 6.5 | 28 days supply | Qty: 28 | Fill #0

## 2019-08-14 MED FILL — LIOTHYRONINE SODIUM 5 MCG T: 5 | 30 days supply | Qty: 30 | Fill #1

## 2019-08-14 MED FILL — SYNTHROID 150 MCG TABLET: 150 | 30 days supply | Qty: 30 | Fill #1

## 2019-08-14 MED FILL — ZOLPIDEM TARTRATE 10 MG TAB: 10 | 30 days supply | Qty: 30 | Fill #0

## 2019-08-14 MED FILL — TEMAZEPAM 30 MG CAPSULE: 30 | 30 days supply | Qty: 30 | Fill #0

## 2019-09-13 MED FILL — ZOLPIDEM TARTRATE 10 MG TAB: 10 | 30 days supply | Qty: 30 | Fill #1

## 2019-09-13 MED FILL — SYNTHROID 150 MCG TABLET: 150 | 30 days supply | Qty: 30 | Fill #2

## 2019-09-13 MED FILL — TEMAZEPAM 30 MG CAPSULE: 30 | 30 days supply | Qty: 30 | Fill #1

## 2019-09-13 MED FILL — LIOTHYRONINE SODIUM 5 MCG T: 5 | 30 days supply | Qty: 30 | Fill #2

## 2019-10-16 MED FILL — TEMAZEPAM 30 MG CAPSULE: 30 | 30 days supply | Qty: 30 | Fill #2

## 2019-10-16 MED FILL — LIOTHYRONINE SODIUM 5 MCG T: 5 | 30 days supply | Qty: 30 | Fill #3

## 2019-10-16 MED FILL — ZOLPIDEM TARTRATE 10 MG TAB: 10 | 30 days supply | Qty: 30 | Fill #2

## 2019-10-16 MED FILL — SYNTHROID 150 MCG TABLET: 150 | 30 days supply | Qty: 30 | Fill #3

## 2019-11-12 MED FILL — LIOTHYRONINE SODIUM 5 MCG T: 5 | 30 days supply | Qty: 30 | Fill #4

## 2019-11-12 MED FILL — SYNTHROID 150 MCG TABLET: 150 | 30 days supply | Qty: 30 | Fill #4

## 2019-11-13 MED FILL — ZOLPIDEM TARTRATE 10 MG TAB: 10 | 30 days supply | Qty: 30 | Fill #0

## 2019-11-14 MED FILL — TEMAZEPAM 30 MG CAPSULE: 30 | 30 days supply | Qty: 30 | Fill #0

## 2019-12-16 MED FILL — SYNTHROID 150 MCG TABLET: 150 | 30 days supply | Qty: 30 | Fill #5

## 2019-12-16 MED FILL — TEMAZEPAM 30 MG CAPSULE: 30 | 30 days supply | Qty: 30 | Fill #1

## 2019-12-16 MED FILL — ZOLPIDEM TARTRATE 10 MG TAB: 10 | 30 days supply | Qty: 30 | Fill #1

## 2019-12-16 MED FILL — LIOTHYRONINE SODIUM 5 MCG T: 5 | 30 days supply | Qty: 30 | Fill #5

## 2020-01-14 MED FILL — SYNTHROID 150 MCG TABLET: 150 | 30 days supply | Qty: 30 | Fill #6

## 2020-01-14 MED FILL — LIOTHYRONINE SODIUM 5 MCG T: 5 | 30 days supply | Qty: 30 | Fill #6

## 2020-01-14 MED FILL — ZOLPIDEM TARTRATE 10 MG TAB: 10 | 30 days supply | Qty: 30 | Fill #2

## 2020-01-14 MED FILL — TEMAZEPAM 30 MG CAPSULE: 30 | 30 days supply | Qty: 30 | Fill #2

## 2020-01-29 MED FILL — SYNTHROID 175 MCG TABLET: 175 | 30 days supply | Qty: 30 | Fill #0

## 2020-01-31 MED FILL — PEG-3350 SOLUTION: 420 | 1 days supply | Qty: 4000 | Fill #0

## 2020-02-13 ENCOUNTER — Other Ambulatory Visit (HOSPITAL_COMMUNITY): Payer: Self-pay | Admitting: Internal Medicine

## 2020-02-13 MED FILL — TEMAZEPAM 30 MG CAPSULE: 30 | 30 days supply | Qty: 30 | Fill #0

## 2020-02-13 MED FILL — ZOLPIDEM TARTRATE 10 MG TAB: 10 | 30 days supply | Qty: 30 | Fill #0

## 2020-02-13 MED FILL — LIOTHYRONINE SODIUM 5 MCG T: 5 | 30 days supply | Qty: 30 | Fill #0

## 2020-02-25 MED FILL — SYNTHROID 175 MCG TABLET: 175 | 30 days supply | Qty: 30 | Fill #1

## 2020-03-26 MED FILL — SYNTHROID 175 MCG TABLET: 175 | 30 days supply | Qty: 30 | Fill #2

## 2020-04-08 MED FILL — ACETAMINOPHEN/COD #3 TABLET: 300-30 | 3 days supply | Qty: 20 | Fill #0

## 2020-04-21 MED FILL — ZOLPIDEM TARTRATE 10 MG TAB: 10 | 30 days supply | Qty: 30 | Fill #0

## 2020-04-21 MED FILL — TEMAZEPAM 30 MG CAPSULE: 30 | 30 days supply | Qty: 30 | Fill #0

## 2020-05-05 MED FILL — TEMAZEPAM 30 MG CAPSULE: 30 | 30 days supply | Qty: 30 | Fill #2

## 2020-05-05 MED FILL — ZOLPIDEM TARTRATE 10 MG TAB: 10 | 30 days supply | Qty: 30 | Fill #2

## 2020-05-05 MED FILL — tiZANidine HCL 2 MG TABS: 2 | 6 days supply | Qty: 20 | Fill #0

## 2020-05-05 MED FILL — LIOTHYRONINE SODIUM 5 MCG T: 5 | 30 days supply | Qty: 30 | Fill #1

## 2020-05-05 MED FILL — SYNTHROID 175 MCG TABLET: 175 | 30 days supply | Qty: 30 | Fill #3

## 2020-05-05 MED FILL — HYDROCODON-APAP 5-325: 5-325 | 5 days supply | Qty: 30 | Fill #0

## 2020-05-27 MED FILL — tiZANidine HCL 2 MG TABS: 2 | 6 days supply | Qty: 20 | Fill #0

## 2020-06-08 MED FILL — LIOTHYRONINE SODIUM 5 MCG T: 5 | 30 days supply | Qty: 30 | Fill #2

## 2020-06-08 MED FILL — ZOLPIDEM TARTRATE 10 MG TAB: 10 | 30 days supply | Qty: 30 | Fill #0

## 2020-06-08 MED FILL — TEMAZEPAM 30 MG CAPSULE: 30 | 30 days supply | Qty: 30 | Fill #0

## 2020-06-08 MED FILL — SYNTHROID 175 MCG TABLET: 175 | 30 days supply | Qty: 30 | Fill #4

## 2020-07-16 MED FILL — LIOTHYRONINE SODIUM 5 MCG T: 5 | 30 days supply | Qty: 30 | Fill #3

## 2020-07-16 MED FILL — SYNTHROID 175 MCG TABLET: 175 | 30 days supply | Qty: 30 | Fill #5

## 2020-07-16 MED FILL — ZOLPIDEM TARTRATE 10 MG TAB: 10 | 30 days supply | Qty: 30 | Fill #1

## 2020-07-16 MED FILL — TEMAZEPAM 30 MG CAPSULE: 30 | 30 days supply | Qty: 30 | Fill #1

## 2020-08-19 ENCOUNTER — Other Ambulatory Visit (HOSPITAL_COMMUNITY): Payer: Self-pay | Admitting: Internal Medicine

## 2020-08-19 MED FILL — LIOTHYRONINE SODIUM 5 MCG T: 5 | 30 days supply | Qty: 30 | Fill #4

## 2020-08-19 MED FILL — ZOLPIDEM TARTRATE 10 MG TAB: 10 | 30 days supply | Qty: 30 | Fill #2

## 2020-08-19 MED FILL — TEMAZEPAM 30 MG CAPSULE: 30 | 30 days supply | Qty: 30 | Fill #2

## 2020-08-19 MED FILL — SYNTHROID 175 MCG TABLET: 175 | 30 days supply | Qty: 30 | Fill #0

## 2020-09-09 MED FILL — SYNTHROID 100 MCG TABLET: 100 | 30 days supply | Qty: 30 | Fill #0

## 2020-09-14 MED FILL — LIOTHYRONINE SODIUM 5 MCG T: 5 | 30 days supply | Qty: 30 | Fill #5

## 2020-09-16 ENCOUNTER — Other Ambulatory Visit (HOSPITAL_COMMUNITY): Payer: Self-pay | Admitting: Family Medicine

## 2020-09-16 MED FILL — traZODone HCL 50 MG TABS: 50 | 30 days supply | Qty: 30 | Fill #0

## 2020-09-17 ENCOUNTER — Other Ambulatory Visit (HOSPITAL_COMMUNITY): Payer: Self-pay | Admitting: Family Medicine

## 2020-09-17 MED FILL — ZOLPIDEM TARTRATE 10 MG TAB: 10 | 30 days supply | Qty: 30 | Fill #0

## 2020-10-06 ENCOUNTER — Other Ambulatory Visit (HOSPITAL_COMMUNITY): Payer: Self-pay | Admitting: Radiology

## 2020-10-09 MED FILL — IMVEXXY MAINTENANCE PACK 10: 10 | 28 days supply | Qty: 8 | Fill #0

## 2020-10-14 MED FILL — SYNTHROID 100 MCG TABLET: 100 | 30 days supply | Qty: 30 | Fill #1

## 2020-10-14 MED FILL — LIOTHYRONINE SODIUM 5 MCG T: 5 | 30 days supply | Qty: 30 | Fill #6

## 2020-10-14 MED FILL — traZODone HCL 50 MG TABS: 50 | 30 days supply | Qty: 30 | Fill #1

## 2020-10-15 ENCOUNTER — Other Ambulatory Visit (HOSPITAL_COMMUNITY): Payer: Self-pay | Admitting: Family Medicine

## 2020-10-16 MED FILL — ZOLPIDEM TARTRATE 10 MG TAB: 10 | 30 days supply | Qty: 30 | Fill #0

## 2020-10-22 ENCOUNTER — Other Ambulatory Visit (HOSPITAL_COMMUNITY): Payer: Self-pay | Admitting: Internal Medicine

## 2020-10-22 MED FILL — SYNTHROID 150 MCG TABLET: 150 | 30 days supply | Qty: 30 | Fill #0

## 2020-11-11 ENCOUNTER — Other Ambulatory Visit (HOSPITAL_COMMUNITY): Payer: Self-pay | Admitting: Family Medicine

## 2020-11-11 MED FILL — traZODone HCL 50 MG TABS: 50 | 30 days supply | Qty: 30 | Fill #0

## 2020-11-11 MED FILL — LIOTHYRONINE SODIUM 5 MCG T: 5 | 30 days supply | Qty: 30 | Fill #0

## 2020-11-12 ENCOUNTER — Other Ambulatory Visit (HOSPITAL_COMMUNITY): Payer: Self-pay | Admitting: Family Medicine

## 2020-11-13 MED FILL — ZOLPIDEM TARTRATE 10 MG TAB: 10 | 30 days supply | Qty: 30 | Fill #0

## 2020-11-25 ENCOUNTER — Other Ambulatory Visit (HOSPITAL_COMMUNITY): Payer: Self-pay | Admitting: Family Medicine

## 2020-12-09 MED FILL — traZODone HCL 50 MG TABS: 50 | 30 days supply | Qty: 30 | Fill #1

## 2020-12-14 ENCOUNTER — Other Ambulatory Visit (HOSPITAL_COMMUNITY): Payer: Self-pay | Admitting: Family Medicine

## 2020-12-14 MED FILL — ZOLPIDEM TARTRATE 10 MG TAB: 10 | 30 days supply | Qty: 30 | Fill #0

## 2020-12-18 ENCOUNTER — Other Ambulatory Visit (HOSPITAL_COMMUNITY): Payer: Self-pay | Admitting: Plastic Surgery

## 2020-12-18 MED FILL — predniSONE 5 MG (21) TBPK: 5 | 6 days supply | Qty: 21 | Fill #0

## 2020-12-18 MED FILL — HYDROCODON-APAP 5-325: 5-325 | 4 days supply | Qty: 30 | Fill #0

## 2020-12-18 MED FILL — CEFADROXIL 500 MG CAPSULE: 500 | 7 days supply | Qty: 14 | Fill #0

## 2020-12-21 ENCOUNTER — Ambulatory Visit: Payer: Self-pay | Admitting: Plastic Surgery

## 2020-12-21 MED ORDER — TRANEXAMIC ACID 1000 MG/10ML IV SOLN: 1000.0000 mg | INTRAVENOUS | Status: AC

## 2020-12-22 ENCOUNTER — Encounter (HOSPITAL_BASED_OUTPATIENT_CLINIC_OR_DEPARTMENT_OTHER): Payer: Self-pay | Admitting: Plastic Surgery

## 2020-12-25 ENCOUNTER — Other Ambulatory Visit (HOSPITAL_COMMUNITY)
Admission: RE | Admit: 2020-12-25 | Discharge: 2020-12-25 | Disposition: A | Discharge status: Home / Self Care | Payer: 59 | Source: Ambulatory Visit | Attending: Plastic Surgery | Admitting: Plastic Surgery

## 2020-12-25 DIAGNOSIS — Z029 Encounter for administrative examinations, unspecified: Secondary | ICD-10-CM | POA: Diagnosis not present

## 2020-12-26 LAB — SARS CORONAVIRUS 2 (TAT 6-24 HRS): SARS Coronavirus 2: NEGATIVE

## 2020-12-28 NOTE — Progress Notes (Signed)
Received faxed office note  from Dr Sherald Hess' office intended to serve as patient's pre-surgical H & P. This note is dated 10/26/20. Called the office and informed that the note was out of date and patient would require an H & P within the past 30 days to proceed with surgery.

## 2020-12-29 ENCOUNTER — Encounter (HOSPITAL_BASED_OUTPATIENT_CLINIC_OR_DEPARTMENT_OTHER)
Admission: RE | Disposition: A | Discharge status: Home / Self Care | Payer: Self-pay | Source: Home / Self Care | Attending: Plastic Surgery

## 2020-12-29 ENCOUNTER — Ambulatory Visit (HOSPITAL_BASED_OUTPATIENT_CLINIC_OR_DEPARTMENT_OTHER): Payer: 59 | Admitting: Certified Registered"

## 2020-12-29 ENCOUNTER — Other Ambulatory Visit: Payer: Self-pay

## 2020-12-29 ENCOUNTER — Encounter (HOSPITAL_BASED_OUTPATIENT_CLINIC_OR_DEPARTMENT_OTHER): Payer: Self-pay | Admitting: Plastic Surgery

## 2020-12-29 ENCOUNTER — Ambulatory Visit (HOSPITAL_BASED_OUTPATIENT_CLINIC_OR_DEPARTMENT_OTHER)
Admission: RE | Admit: 2020-12-29 | Discharge: 2020-12-29 | Disposition: A | Discharge status: Home / Self Care | Payer: 59 | Attending: Plastic Surgery | Admitting: Plastic Surgery

## 2020-12-29 DIAGNOSIS — Z803 Family history of malignant neoplasm of breast: Secondary | ICD-10-CM | POA: Diagnosis not present

## 2020-12-29 DIAGNOSIS — Z87891 Personal history of nicotine dependence: Secondary | ICD-10-CM | POA: Insufficient documentation

## 2020-12-29 DIAGNOSIS — Z79899 Other long term (current) drug therapy: Secondary | ICD-10-CM | POA: Insufficient documentation

## 2020-12-29 DIAGNOSIS — Z79890 Hormone replacement therapy (postmenopausal): Secondary | ICD-10-CM | POA: Diagnosis not present

## 2020-12-29 DIAGNOSIS — N62 Hypertrophy of breast: Secondary | ICD-10-CM | POA: Insufficient documentation

## 2020-12-29 HISTORY — PX: BREAST REDUCTION SURGERY: SHX8

## 2020-12-29 SURGERY — MAMMOPLASTY, REDUCTION
Anesthesia: General | Site: Breast | Laterality: Bilateral

## 2020-12-29 MED ORDER — FENTANYL CITRATE (PF) 100 MCG/2ML IJ SOLN
INTRAMUSCULAR | Status: AC
  Filled 2020-12-29: qty 2

## 2020-12-29 MED ORDER — BACITRACIN ZINC 500 UNIT/GM EX OINT
TOPICAL_OINTMENT | CUTANEOUS | Status: DC | PRN
  Administered 2020-12-29: 1 via TOPICAL

## 2020-12-29 MED ORDER — BACITRACIN ZINC 500 UNIT/GM EX OINT
TOPICAL_OINTMENT | CUTANEOUS | Status: AC
  Filled 2020-12-29: qty 28.35

## 2020-12-29 MED ORDER — LIDOCAINE 2% (20 MG/ML) 5 ML SYRINGE
INTRAMUSCULAR | Status: AC
  Filled 2020-12-29: qty 5

## 2020-12-29 MED ORDER — FENTANYL CITRATE (PF) 100 MCG/2ML IJ SOLN
25.0000 ug | INTRAMUSCULAR | Status: DC | PRN
  Administered 2020-12-29 (×2): 50 ug via INTRAVENOUS

## 2020-12-29 MED ORDER — LACTATED RINGERS IV SOLN: INTRAVENOUS | Status: DC

## 2020-12-29 MED ORDER — FENTANYL CITRATE (PF) 100 MCG/2ML IJ SOLN
INTRAMUSCULAR | Status: DC | PRN
  Administered 2020-12-29: 100 ug via INTRAVENOUS
  Administered 2020-12-29 (×4): 50 ug via INTRAVENOUS

## 2020-12-29 MED ORDER — DEXAMETHASONE SODIUM PHOSPHATE 10 MG/ML IJ SOLN
INTRAMUSCULAR | Status: AC
  Filled 2020-12-29: qty 1

## 2020-12-29 MED ORDER — TRANEXAMIC ACID-NACL 1000-0.7 MG/100ML-% IV SOLN
INTRAVENOUS | Status: DC | PRN
  Administered 2020-12-29: 1000 mg via INTRAVENOUS

## 2020-12-29 MED ORDER — CHLORHEXIDINE GLUCONATE CLOTH 2 % EX PADS: 6.0000 | MEDICATED_PAD | Freq: Once | CUTANEOUS | Status: DC

## 2020-12-29 MED ORDER — AMISULPRIDE (ANTIEMETIC) 5 MG/2ML IV SOLN
10.0000 mg | Freq: Once | INTRAVENOUS | Status: AC | PRN
  Administered 2020-12-29: 10 mg via INTRAVENOUS

## 2020-12-29 MED ORDER — PROPOFOL 500 MG/50ML IV EMUL
INTRAVENOUS | Status: AC
  Filled 2020-12-29: qty 50

## 2020-12-29 MED ORDER — TRANEXAMIC ACID-NACL 1000-0.7 MG/100ML-% IV SOLN
INTRAVENOUS | Status: AC
  Filled 2020-12-29: qty 100

## 2020-12-29 MED ORDER — LIDOCAINE-EPINEPHRINE 1 %-1:100000 IJ SOLN
INTRAMUSCULAR | Status: AC
  Filled 2020-12-29: qty 1

## 2020-12-29 MED ORDER — CEFAZOLIN SODIUM-DEXTROSE 2-4 GM/100ML-% IV SOLN
INTRAVENOUS | Status: AC
  Filled 2020-12-29: qty 100

## 2020-12-29 MED ORDER — MIDAZOLAM HCL 2 MG/2ML IJ SOLN
INTRAMUSCULAR | Status: AC
  Filled 2020-12-29: qty 2

## 2020-12-29 MED ORDER — SODIUM CHLORIDE 0.9 % IV SOLN
INTRAVENOUS | Status: DC | PRN
  Administered 2020-12-29: 140 mL

## 2020-12-29 MED ORDER — ONDANSETRON HCL 4 MG/2ML IJ SOLN
INTRAMUSCULAR | Status: DC | PRN
Start: 2020-12-29 — End: 2020-12-29
  Administered 2020-12-29: 4 mg via INTRAVENOUS

## 2020-12-29 MED ORDER — PROPOFOL 10 MG/ML IV BOLUS
INTRAVENOUS | Status: AC
  Filled 2020-12-29: qty 20

## 2020-12-29 MED ORDER — ROCURONIUM BROMIDE 10 MG/ML (PF) SYRINGE
PREFILLED_SYRINGE | INTRAVENOUS | Status: AC
  Filled 2020-12-29: qty 20

## 2020-12-29 MED ORDER — AMISULPRIDE (ANTIEMETIC) 5 MG/2ML IV SOLN
INTRAVENOUS | Status: AC
  Filled 2020-12-29: qty 4

## 2020-12-29 MED ORDER — OXYCODONE HCL 5 MG/5ML PO SOLN: 5.0000 mg | Freq: Once | ORAL | Status: DC | PRN

## 2020-12-29 MED ORDER — ACETAMINOPHEN 10 MG/ML IV SOLN
INTRAVENOUS | Status: DC | PRN
  Administered 2020-12-29: 1000 mg via INTRAVENOUS

## 2020-12-29 MED ORDER — BUPIVACAINE-EPINEPHRINE (PF) 0.5% -1:200000 IJ SOLN
INTRAMUSCULAR | Status: AC
  Filled 2020-12-29: qty 30

## 2020-12-29 MED ORDER — SUGAMMADEX SODIUM 200 MG/2ML IV SOLN
INTRAVENOUS | Status: DC | PRN
  Administered 2020-12-29: 200 mg via INTRAVENOUS

## 2020-12-29 MED ORDER — BUPIVACAINE HCL (PF) 0.25 % IJ SOLN
INTRAMUSCULAR | Status: AC
  Filled 2020-12-29: qty 30

## 2020-12-29 MED ORDER — PROPOFOL 10 MG/ML IV BOLUS
INTRAVENOUS | Status: DC | PRN
  Administered 2020-12-29: 20 mg via INTRAVENOUS
  Administered 2020-12-29: 200 mg via INTRAVENOUS
  Administered 2020-12-29: 25 ug/kg/min via INTRAVENOUS

## 2020-12-29 MED ORDER — MIDAZOLAM HCL 5 MG/5ML IJ SOLN
INTRAMUSCULAR | Status: DC | PRN
  Administered 2020-12-29: 2 mg via INTRAVENOUS

## 2020-12-29 MED ORDER — DEXAMETHASONE SODIUM PHOSPHATE 10 MG/ML IJ SOLN
INTRAMUSCULAR | Status: DC | PRN
  Administered 2020-12-29: 10 mg via INTRAVENOUS

## 2020-12-29 MED ORDER — ONDANSETRON HCL 4 MG/2ML IJ SOLN
INTRAMUSCULAR | Status: AC
  Filled 2020-12-29: qty 2

## 2020-12-29 MED ORDER — BUPIVACAINE LIPOSOME 1.3 % IJ SUSP
INTRAMUSCULAR | Status: AC
  Filled 2020-12-29: qty 20

## 2020-12-29 MED ORDER — ONDANSETRON HCL 4 MG/2ML IJ SOLN: 4.0000 mg | Freq: Once | INTRAMUSCULAR | Status: DC | PRN

## 2020-12-29 MED ORDER — OXYCODONE HCL 5 MG PO TABS: 5.0000 mg | ORAL_TABLET | Freq: Once | ORAL | Status: DC | PRN

## 2020-12-29 MED ORDER — CEFAZOLIN SODIUM-DEXTROSE 2-4 GM/100ML-% IV SOLN
2.0000 g | INTRAVENOUS | Status: AC
  Administered 2020-12-29: 2 g via INTRAVENOUS

## 2020-12-29 MED ORDER — LIDOCAINE 2% (20 MG/ML) 5 ML SYRINGE
INTRAMUSCULAR | Status: DC | PRN
  Administered 2020-12-29: 100 mg via INTRAVENOUS

## 2020-12-29 MED ORDER — SODIUM CHLORIDE (PF) 0.9 % IJ SOLN
INTRAMUSCULAR | Status: DC | PRN
  Administered 2020-12-29: 60 mL

## 2020-12-29 MED ORDER — ROCURONIUM BROMIDE 100 MG/10ML IV SOLN
INTRAVENOUS | Status: DC | PRN
  Administered 2020-12-29: 10 mg via INTRAVENOUS
  Administered 2020-12-29: 80 mg via INTRAVENOUS
  Administered 2020-12-29: 20 mg via INTRAVENOUS
  Administered 2020-12-29: 10 mg via INTRAVENOUS
  Administered 2020-12-29: 20 mg via INTRAVENOUS

## 2020-12-29 SURGICAL SUPPLY — 61 items
APL SKNCLS STERI-STRIP NONHPOA (GAUZE/BANDAGES/DRESSINGS) ×4
BENZOIN TINCTURE PRP APPL 2/3 (GAUZE/BANDAGES/DRESSINGS) ×6 IMPLANT
BLADE KNIFE PERSONA 15 (BLADE) ×15 IMPLANT
BNDG GAUZE ELAST 4 BULKY (GAUZE/BANDAGES/DRESSINGS) IMPLANT
CANISTER SUCT 1200ML W/VALVE (MISCELLANEOUS) ×3 IMPLANT
CAP BOUFFANT 24 BLUE NURSES (PROTECTIVE WEAR) ×3 IMPLANT
COVER BACK TABLE 60X90IN (DRAPES) ×3 IMPLANT
COVER MAYO STAND STRL (DRAPES) ×3 IMPLANT
COVER WAND RF STERILE (DRAPES) IMPLANT
DECANTER SPIKE VIAL GLASS SM (MISCELLANEOUS) ×6 IMPLANT
DRAIN CHANNEL 10M FLAT 3/4 FLT (DRAIN) IMPLANT
DRAIN CHANNEL 7F 3/4 FLAT (WOUND CARE) IMPLANT
DRAPE LAPAROSCOPIC ABDOMINAL (DRAPES) ×3 IMPLANT
DRSG EMULSION OIL 3X3 NADH (GAUZE/BANDAGES/DRESSINGS) IMPLANT
DRSG PAD ABDOMINAL 8X10 ST (GAUZE/BANDAGES/DRESSINGS) IMPLANT
DRSG TELFA 3X8 NADH (GAUZE/BANDAGES/DRESSINGS) IMPLANT
ELECT REM PT RETURN 9FT ADLT (ELECTROSURGICAL) ×3
ELECTRODE REM PT RTRN 9FT ADLT (ELECTROSURGICAL) ×2 IMPLANT
EVACUATOR SILICONE 100CC (DRAIN) IMPLANT
FILTER 7/8 IN (FILTER) ×3 IMPLANT
GAUZE SPONGE 4X4 12PLY STRL (GAUZE/BANDAGES/DRESSINGS) ×3 IMPLANT
GLOVE SURG ENC MOIS LTX SZ6.5 (GLOVE) ×3 IMPLANT
GLOVE SURG LTX SZ6.5 (GLOVE) ×6 IMPLANT
GLOVE SURG PR MICRO ENCORE 7 (GLOVE) ×3 IMPLANT
GLOVE SURG UNDER POLY LF SZ7 (GLOVE) ×6 IMPLANT
GOWN STRL REUS W/ TWL LRG LVL3 (GOWN DISPOSABLE) ×2 IMPLANT
GOWN STRL REUS W/TWL LRG LVL3 (GOWN DISPOSABLE) ×3
IV CONNECTOR CLAVE PORT MALE (IV SETS) ×3 IMPLANT
IV NS 500ML (IV SOLUTION)
IV NS 500ML BAXH (IV SOLUTION) IMPLANT
MANIFOLD NEPTUNE II (INSTRUMENTS) IMPLANT
NEEDLE HYPO 25X1 1.5 SAFETY (NEEDLE) ×6 IMPLANT
NEEDLE SPNL 18GX3.5 QUINCKE PK (NEEDLE) ×3 IMPLANT
NS IRRIG 1000ML POUR BTL (IV SOLUTION) ×6 IMPLANT
PACK BASIN DAY SURGERY FS (CUSTOM PROCEDURE TRAY) ×3 IMPLANT
PENCIL SMOKE EVACUATOR (MISCELLANEOUS) ×3 IMPLANT
PIN SAFETY STERILE (MISCELLANEOUS) IMPLANT
SCRUB TECHNI CARE 4 OZ NO DYE (MISCELLANEOUS) ×3 IMPLANT
SLEEVE SCD COMPRESS KNEE MED (MISCELLANEOUS) ×3 IMPLANT
SPONGE LAP 18X18 RF (DISPOSABLE) ×15 IMPLANT
STAPLER VISISTAT 35W (STAPLE) ×3 IMPLANT
STRIP CLOSURE SKIN 1/2X4 (GAUZE/BANDAGES/DRESSINGS) ×6 IMPLANT
SUT ETHIBOND 3-0 V-5 (SUTURE) IMPLANT
SUT ETHILON 3 0 PS 1 (SUTURE) ×6 IMPLANT
SUT MNCRL AB 3-0 PS2 18 (SUTURE) ×12 IMPLANT
SUT MNCRL AB 4-0 PS2 18 (SUTURE) ×12 IMPLANT
SUT MON AB 5-0 PS2 18 (SUTURE) ×6 IMPLANT
SUT PROLENE 3 0 PS 1 (SUTURE) IMPLANT
SUT VLOC 90 P-14 23 (SUTURE) ×6 IMPLANT
SYR 50ML LL SCALE MARK (SYRINGE) ×3 IMPLANT
SYR BULB IRRIG 60ML STRL (SYRINGE) ×6 IMPLANT
SYR CONTROL 10ML LL (SYRINGE) ×9 IMPLANT
TAPE MEASURE VINYL STERILE (MISCELLANEOUS) ×3 IMPLANT
TOWEL GREEN STERILE FF (TOWEL DISPOSABLE) ×9 IMPLANT
TRAY DSU PREP LF (CUSTOM PROCEDURE TRAY) ×3 IMPLANT
TRAY FOL W/BAG SLVR 16FR STRL (SET/KITS/TRAYS/PACK) IMPLANT
TRAY FOLEY W/BAG SLVR 14FR LF (SET/KITS/TRAYS/PACK) IMPLANT
TRAY FOLEY W/BAG SLVR 16FR LF (SET/KITS/TRAYS/PACK)
TUBE CONNECTING 20X1/4 (TUBING) ×3 IMPLANT
UNDERPAD 30X36 HEAVY ABSORB (UNDERPADS AND DIAPERS) ×6 IMPLANT
YANKAUER SUCT BULB TIP NO VENT (SUCTIONS) ×3 IMPLANT

## 2020-12-29 NOTE — Discharge Instructions (Signed)
Next dose of Tylenol can be given after 6:00PM.     JP Drain Totals  Bring this sheet to all of your post-operative appointments while you have your drains.  Please measure your drains by CC's or ML's.  Make sure you drain and measure your JP Drains 2 or 3 times per day.  At the end of each day, add up totals for the left side and add up totals for the right side.    ( 9 am )     ( 3 pm )        ( 9 pm )                Date L  R  L  R  L  R  Total L/R                                                                                                                                                                                            Information for Discharge Teaching: EXPAREL (bupivacaine liposome injectable suspension)   Your surgeon or anesthesiologist gave you EXPAREL(bupivacaine) to help control your pain after surgery.   EXPAREL is a local anesthetic that provides pain relief by numbing the tissue around the surgical site.  EXPAREL is designed to release pain medication over time and can control pain for up to 72 hours.  Depending on how you respond to EXPAREL, you may require less pain medication during your recovery.  Possible side effects:  Temporary loss of sensation or ability to move in the area where bupivacaine was injected.  Nausea, vomiting, constipation  Rarely, numbness and tingling in your mouth or lips, lightheadedness, or anxiety may occur.  Call your doctor right away if you think you may be experiencing any of these sensations, or if you have other questions regarding possible side effects.  Follow all other discharge instructions given to you by your surgeon or nurse. Eat a healthy diet and drink plenty of water or other fluids.  If you return to the hospital for any reason within 96 hours following the administration of EXPAREL, it is important for health care providers to know that you have received this anesthetic. A teal colored  band has been placed on your arm with the date, time and amount of EXPAREL you have received in order to alert and inform your health care providers. Please leave this armband in place for the full 96 hours following administration, and then you may remove the band.     Post Anesthesia Home Care Instructions  Activity: Get plenty of rest for the remainder of the day. A  responsible individual must stay with you for 24 hours following the procedure.  For the next 24 hours, DO NOT: -Drive a car -Advertising copywriter -Drink alcoholic beverages -Take any medication unless instructed by your physician -Make any legal decisions or sign important papers.  Meals: Start with liquid foods such as gelatin or soup. Progress to regular foods as tolerated. Avoid greasy, spicy, heavy foods. If nausea and/or vomiting occur, drink only clear liquids until the nausea and/or vomiting subsides. Call your physician if vomiting continues.  Special Instructions/Symptoms: Your throat may feel dry or sore from the anesthesia or the breathing tube placed in your throat during surgery. If this causes discomfort, gargle with warm salt water. The discomfort should disappear within 24 hours.  If you had a scopolamine patch placed behind your ear for the management of post- operative nausea and/or vomiting:  1. The medication in the patch is effective for 72 hours, after which it should be removed.  Wrap patch in a tissue and discard in the trash. Wash hands thoroughly with soap and water. 2. You may remove the patch earlier than 72 hours if you experience unpleasant side effects which may include dry mouth, dizziness or visual disturbances. 3. Avoid touching the patch. Wash your hands with soap and water after contact with the patch.          1. No lifting greater than 5 lbs with arms for 4 weeks. 2. Empty, strip, record and reactivate JP drains 3 times a day. 3. Vicodin 5/325 mg tabs 1-2 tabs po q 4-6 hours prn  pain- prescription given in office. 4. Duricef 1 tab po bid- prescription given in office. 5. Sterapred dose pack as directed- prescription given in office. 6. Follow-up appointment Friday in office.

## 2020-12-29 NOTE — Op Note (Signed)
OPERATIVE REPORT  12/29/2020  Aimee Daniels  PREOPERATIVE DIAGNOSIS:  Bilateral Macromastia.  POSTOPERATIVE DIAGNOSIS:  Bilateral Macromastia.  PROCEDURE:  Bilateral Reduction Mammoplasties.  ATTENDING SURGEON:  Eloise Levels, MD  ASSISTANT: Adrienne Mocha, RNFA  ANESTHESIA:  General.  ANESTHESIOLOGIST: C. Darlin Drop, MD  COMPLICATIONS:  None.  INDICATIONS FOR THE PROCEDURE:  The patient is a 55 y.o. female who has bilateral macromastia that is clinically symptomatic.  She presents to undergo bilateral reduction mammoplasties.  DESCRIPTION OF PROCEDURE:  The patient was marked in preop holding area in a pattern of Wise for the future bilateral reduction mammoplasties. She was then taken back to the OR, placed on the table in supine position.  After adequate general anesthesia was obtained, the patient's chest was prepped with Techni-Care and draped in sterile fashion.  The bases of the breasts have been infiltrated with 1% lidocaine with epinephrine.  After adequate hemostasis and anesthesia taken effect, the procedure was begun.  Both of the breast reductions were performed in the following similar manner.  The nipple-areolar complex was marked with a 45-mm nipple marker.  The skin was then incised and deepithelialized around the nipple-areolar complex down to the inframammary crease in the inferior pedicle pattern.  Next, the medial, superior, and lateral skin flaps were elevated down to the chest wall.  Excess fat and glandular tissue removed from the inferior pedicle.  The nipple-areolar complex was examined and found to be pink and viable.  The wound was irrigated with saline irrigation.  Meticulous hemostasis was obtained with the Bovie electrocautery.  Inferior pedicle was centralized using 3-0 Prolene suture.  A #10 JP flat fully fluted drain was placed into the wound. The skin flaps were brought together at the inverted T junction with a 2- 0  Prolene suture.  The incisions were stapled for temporary closure. The breasts compared and found to have good shape and symmetry.  The incisions were then closed from the medial aspect of the JP drain to the medial aspect of the [facility] incision by first placing a few 3-0 Monocryl sutures to tack together the dermal layer, and then both the dermal and cuticular layer were closed in a single layer using a 3-0 V-lock barbed suture.  Lateral to the JP drain incision was closed using 3-0 Monocryl in the dermal layer, followed by 3-0 Monocryl running intracuticular stitch on the skin.  The vertical limb of the Wise pattern was closed in the dermal layer using 3-0 Monocryl suture.  The patient was placed in the upright position.  The future location of the nipple-areolar complexes was marked on both breast mounds using the 45-mm nipple marker.  She was then placed back in the recumbent position.  Both of the nipple areolar complexes were brought out onto the breast mounds in the following similar manner.  The skin was incised as marked and removed in full thickness into the subcutaneous tissues.  The nipple- areolar complex was examined, found to be pink and viable, then brought out through this aperture and sewn in place using 4-0 Monocryl in the dermal layer, followed by 5-0 Monocryl running intracuticular stitch on the skin.  This 5-0 Monocryl suture was then brought down to close the cuticular layer of the vertical limb as well.  The JP drain was sewn in place using 3-0 nylon suture.  The pectoralis major muscle and fascia along with the breast and chest soft tissues were then infiltrated with 1% Exparel (total 266 mg).  Now the [facility]  incision was also infiltrated with the Exparel in order to give the patient postoperative pain control.  The incisions were dressed with benzoin, Steri-Strips, and the nipples dressed with bacitracin ointment and Adaptic.  4x4s were placed over the incisions and  ABD pads in the axillary areas.  The patient was placed into a light postoperative support bra.  There were no complications. The patient tolerated the procedure well.  The final needle, sponge counts were reported to be correct at the end of the case.  The patient was then recovered without complications.  Both the patient and her family were given proper postoperative wound care instructions. She was then discharged home in the care of her family in stable condition.  Follow up will be with me in a few days in the office.         Eloise Levels, M.D.  12/29/2020 12:35 PM

## 2020-12-29 NOTE — Transfer of Care (Signed)
Immediate Anesthesia Transfer of Care Note  Patient: Aimee Daniels  Procedure(s) Performed: MAMMARY REDUCTION  (BREAST) (Bilateral Breast)  Patient Location: PACU  Anesthesia Type:General  Level of Consciousness: awake, alert  and oriented  Airway & Oxygen Therapy: Patient Spontanous Breathing and Patient connected to face mask oxygen  Post-op Assessment: Report given to RN and Post -op Vital signs reviewed and stable  Post vital signs: Reviewed and stable  Last Vitals:  Vitals Value Taken Time  BP 137/79 12/29/20 1257  Temp    Pulse 91 12/29/20 1258  Resp    SpO2 98 % 12/29/20 1258    Last Pain:  Vitals:   12/29/20 0647  PainSc: 0-No pain      Patients Stated Pain Goal: 3 (12/29/20 0647)  Complications: No complications documented.

## 2020-12-29 NOTE — Anesthesia Postprocedure Evaluation (Signed)
Anesthesia Post Note  Patient: Mee A Pembroke  Procedure(s) Performed: MAMMARY REDUCTION  (BREAST) (Bilateral Breast)     Patient location during evaluation: PACU Anesthesia Type: General Level of consciousness: awake and alert Pain management: pain level controlled Vital Signs Assessment: post-procedure vital signs reviewed and stable Respiratory status: spontaneous breathing, nonlabored ventilation and respiratory function stable Cardiovascular status: blood pressure returned to baseline and stable Postop Assessment: no apparent nausea or vomiting Anesthetic complications: no   No complications documented.  Last Vitals:  Vitals:   12/29/20 1415 12/29/20 1453  BP: 129/80 117/79  Pulse: 87 92  Resp: 16 14  Temp:  36.9 C  SpO2: 99% 96%    Last Pain:  Vitals:   12/29/20 1453  PainSc: 2                  Lucretia Kern

## 2020-12-29 NOTE — Anesthesia Preprocedure Evaluation (Signed)
Anesthesia Evaluation  Patient identified by MRN, date of birth, ID band Patient awake    Reviewed: Allergy & Precautions, NPO status , Patient's Chart, lab work & pertinent test results  History of Anesthesia Complications Negative for: history of anesthetic complications  Airway Mallampati: II  TM Distance: >3 FB Neck ROM: Full    Dental  (+) Teeth Intact   Pulmonary neg pulmonary ROS,    Pulmonary exam normal        Cardiovascular negative cardio ROS Normal cardiovascular exam     Neuro/Psych negative neurological ROS  negative psych ROS   GI/Hepatic negative GI ROS, Neg liver ROS,   Endo/Other  Hypothyroidism Morbid obesity  Renal/GU negative Renal ROS  negative genitourinary   Musculoskeletal negative musculoskeletal ROS (+)   Abdominal   Peds  Hematology negative hematology ROS (+)   Anesthesia Other Findings   Reproductive/Obstetrics                             Anesthesia Physical Anesthesia Plan  ASA: III  Anesthesia Plan: General   Post-op Pain Management:    Induction: Intravenous  PONV Risk Score and Plan: 3 and Ondansetron, Dexamethasone, Treatment may vary due to age or medical condition and Midazolam  Airway Management Planned: Oral ETT  Additional Equipment: None  Intra-op Plan:   Post-operative Plan: Extubation in OR  Informed Consent: I have reviewed the patients History and Physical, chart, labs and discussed the procedure including the risks, benefits and alternatives for the proposed anesthesia with the patient or authorized representative who has indicated his/her understanding and acceptance.     Dental advisory given  Plan Discussed with:   Anesthesia Plan Comments:         Anesthesia Quick Evaluation

## 2020-12-29 NOTE — Anesthesia Procedure Notes (Addendum)
Procedure Name: Intubation Date/Time: 12/29/2020 7:59 AM Performed by: Ezequiel Kayser, CRNA Pre-anesthesia Checklist: Patient identified, Emergency Drugs available, Suction available and Patient being monitored Patient Re-evaluated:Patient Re-evaluated prior to induction Oxygen Delivery Method: Circle System Utilized Preoxygenation: Pre-oxygenation with 100% oxygen Induction Type: IV induction Ventilation: Mask ventilation without difficulty Laryngoscope Size: Mac and 3 Grade View: Grade II Tube type: Oral Tube size: 7.0 mm Number of attempts: 1 Airway Equipment and Method: Stylet and Oral airway Placement Confirmation: ETT inserted through vocal cords under direct vision,  positive ETCO2 and breath sounds checked- equal and bilateral Secured at: 22 cm Tube secured with: Tape Dental Injury: Teeth and Oropharynx as per pre-operative assessment  Comments: Eyes taped prior to mask ventilation

## 2020-12-29 NOTE — Brief Op Note (Signed)
12/29/2020  12:32 PM  PATIENT:  Briceida A Benak  55 y.o. female  PRE-OPERATIVE DIAGNOSIS:  BILATERAL MACROMASTIA  POST-OPERATIVE DIAGNOSIS:  BILATERAL MACROMASTIA  PROCEDURE:  Procedure(s): MAMMARY REDUCTION  (BREAST) (Bilateral)  SURGEON:  Surgeon(s) and Role:    * Shantale Holtmeyer, Chales Abrahams, MD - Primary  ASSISTANTS: Adrienne Mocha, RNFA   ANESTHESIA:   general  EBL:  150 mL   BLOOD ADMINISTERED:none  DRAINS: (67F) Jackson-Pratt drain(s) with closed bulb suction in the Bilateral Breasts   LOCAL MEDICATIONS USED:  1.3% Exparel (total 266 mgs.)  SPECIMEN:  Source of Specimen:  Bilateral Breasts  DISPOSITION OF SPECIMEN:  PATHOLOGY  COUNTS:  YES  DICTATION: .Note written in EPIC  PLAN OF CARE: Discharge to home after PACU  PATIENT DISPOSITION:  PACU - hemodynamically stable.   Delay start of Pharmacological VTE agent (>24hrs) due to surgical blood loss or risk of bleeding: not applicable

## 2020-12-29 NOTE — H&P (Signed)
  H&P faxed to surgical center.  -History and Physical Reviewed  -Patient has been re-examined  -No change in the plan of care  Anes Rigel A    

## 2020-12-30 ENCOUNTER — Encounter (HOSPITAL_BASED_OUTPATIENT_CLINIC_OR_DEPARTMENT_OTHER): Payer: Self-pay | Admitting: Plastic Surgery

## 2020-12-30 LAB — SURGICAL PATHOLOGY

## 2021-01-09 MED FILL — LIOTHYRONINE SODIUM 5 MCG T: 5 | 30 days supply | Qty: 30 | Fill #1

## 2021-01-11 ENCOUNTER — Other Ambulatory Visit (HOSPITAL_COMMUNITY): Payer: Self-pay | Admitting: Family Medicine

## 2021-01-11 MED FILL — traZODone HCL 50 MG TABS: 50 | 30 days supply | Qty: 30 | Fill #0

## 2021-01-11 MED FILL — SYNTHROID 150 MCG TABLET: 150 | 30 days supply | Qty: 30 | Fill #0

## 2021-01-19 ENCOUNTER — Other Ambulatory Visit: Payer: Self-pay | Admitting: Gastroenterology

## 2021-01-19 DIAGNOSIS — B1910 Unspecified viral hepatitis B without hepatic coma: Secondary | ICD-10-CM

## 2021-02-05 ENCOUNTER — Other Ambulatory Visit: Payer: 59

## 2021-02-16 ENCOUNTER — Other Ambulatory Visit (HOSPITAL_COMMUNITY): Payer: Self-pay

## 2021-02-16 MED ORDER — TRAZODONE HCL 100 MG PO TABS
100.0000 mg | ORAL_TABLET | Freq: Every evening | ORAL | 1 refills | Status: AC
  Filled 2021-02-16: qty 30, 30d supply, fill #0

## 2021-02-17 MED FILL — Liothyronine Sodium Tab 5 MCG: ORAL | 30 days supply | Qty: 30 | Fill #0 | Status: AC

## 2021-02-17 MED FILL — Levothyroxine Sodium Tab 150 MCG: ORAL | 30 days supply | Qty: 30 | Fill #0 | Status: AC

## 2021-02-18 ENCOUNTER — Other Ambulatory Visit (HOSPITAL_COMMUNITY): Payer: Self-pay

## 2021-02-19 ENCOUNTER — Ambulatory Visit
Admission: RE | Admit: 2021-02-19 | Discharge: 2021-02-19 | Disposition: A | Discharge status: Home / Self Care | Payer: 59 | Source: Ambulatory Visit | Attending: Gastroenterology | Admitting: Gastroenterology

## 2021-02-19 DIAGNOSIS — B1910 Unspecified viral hepatitis B without hepatic coma: Secondary | ICD-10-CM

## 2021-02-23 ENCOUNTER — Other Ambulatory Visit: Payer: Self-pay | Admitting: Gastroenterology

## 2021-02-23 DIAGNOSIS — R932 Abnormal findings on diagnostic imaging of liver and biliary tract: Secondary | ICD-10-CM

## 2021-02-24 ENCOUNTER — Other Ambulatory Visit (HOSPITAL_COMMUNITY): Payer: Self-pay

## 2021-02-24 MED ORDER — ZOLPIDEM TARTRATE 5 MG PO TABS
ORAL_TABLET | ORAL | 0 refills | Status: AC
  Filled 2021-02-24 – 2021-08-20 (×3): qty 10, 10d supply, fill #0

## 2021-03-09 ENCOUNTER — Ambulatory Visit
Admission: RE | Admit: 2021-03-09 | Discharge: 2021-03-09 | Disposition: A | Discharge status: Home / Self Care | Payer: 59 | Source: Ambulatory Visit | Attending: Gastroenterology | Admitting: Gastroenterology

## 2021-03-09 ENCOUNTER — Other Ambulatory Visit: Payer: Self-pay

## 2021-03-09 DIAGNOSIS — R932 Abnormal findings on diagnostic imaging of liver and biliary tract: Secondary | ICD-10-CM

## 2021-03-09 MED ORDER — GADOBENATE DIMEGLUMINE 529 MG/ML IV SOLN
20.0000 mL | Freq: Once | INTRAVENOUS | Status: AC | PRN
  Administered 2021-03-09: 20 mL via INTRAVENOUS

## 2021-04-10 ENCOUNTER — Other Ambulatory Visit (HOSPITAL_COMMUNITY): Payer: Self-pay

## 2021-05-21 ENCOUNTER — Other Ambulatory Visit (HOSPITAL_COMMUNITY): Payer: Self-pay

## 2021-05-27 ENCOUNTER — Other Ambulatory Visit (HOSPITAL_COMMUNITY): Payer: Self-pay

## 2021-05-27 MED ORDER — TRAZODONE HCL 100 MG PO TABS
100.0000 mg | ORAL_TABLET | Freq: Every day | ORAL | 3 refills | Status: AC
  Filled 2021-05-27: qty 30, 30d supply, fill #0
  Filled 2021-06-23: qty 30, 30d supply, fill #1
  Filled 2021-07-26: qty 30, 30d supply, fill #2
  Filled 2021-08-19: qty 30, 30d supply, fill #3
  Filled 2021-10-02: qty 30, 30d supply, fill #4
  Filled 2021-10-28: qty 30, 30d supply, fill #5

## 2021-05-27 MED ORDER — LEVOTHYROXINE SODIUM 150 MCG PO TABS
ORAL_TABLET | ORAL | 30 refills | Status: DC
  Filled 2021-05-27: qty 30, 30d supply, fill #0
  Filled 2021-06-23: qty 30, 30d supply, fill #1
  Filled 2021-07-26: qty 30, 30d supply, fill #2
  Filled 2021-08-19: qty 30, 30d supply, fill #3
  Filled 2021-10-02: qty 30, 30d supply, fill #4

## 2021-05-27 MED ORDER — ZOLPIDEM TARTRATE 5 MG PO TABS
5.0000 mg | ORAL_TABLET | Freq: Every evening | ORAL | 0 refills | Status: AC
  Filled 2021-05-27: qty 30, 30d supply, fill #0
  Filled 2021-06-23: qty 30, 30d supply, fill #1
  Filled 2021-07-26: qty 30, 30d supply, fill #2

## 2021-05-28 ENCOUNTER — Other Ambulatory Visit (HOSPITAL_COMMUNITY): Payer: Self-pay

## 2021-05-28 MED ORDER — LIOTHYRONINE SODIUM 5 MCG PO TABS
ORAL_TABLET | ORAL | 5 refills | Status: DC
  Filled 2021-05-28 – 2021-06-23 (×3): qty 30, 30d supply, fill #0
  Filled 2021-07-26: qty 30, 30d supply, fill #1
  Filled 2021-08-19: qty 30, 30d supply, fill #2
  Filled 2021-10-02: qty 30, 30d supply, fill #3
  Filled 2021-10-28: qty 30, 30d supply, fill #4
  Filled 2021-11-30: qty 30, 30d supply, fill #5

## 2021-05-28 MED ORDER — SYNTHROID 150 MCG PO TABS: ORAL_TABLET | ORAL | 5 refills | Status: AC

## 2021-06-05 ENCOUNTER — Other Ambulatory Visit (HOSPITAL_COMMUNITY): Payer: Self-pay

## 2021-06-14 ENCOUNTER — Other Ambulatory Visit (HOSPITAL_COMMUNITY): Payer: Self-pay

## 2021-06-14 MED ORDER — SYNTHROID 150 MCG PO TABS
ORAL_TABLET | ORAL | 11 refills | Status: AC
  Filled 2021-06-14: qty 30, 30d supply, fill #0

## 2021-06-14 MED ORDER — LIOTHYRONINE SODIUM 5 MCG PO TABS
ORAL_TABLET | ORAL | 11 refills | Status: AC
  Filled 2021-06-14: qty 30, 30d supply, fill #0

## 2021-06-21 ENCOUNTER — Other Ambulatory Visit (HOSPITAL_COMMUNITY): Payer: Self-pay

## 2021-06-23 ENCOUNTER — Other Ambulatory Visit (HOSPITAL_COMMUNITY): Payer: Self-pay

## 2021-07-26 ENCOUNTER — Other Ambulatory Visit (HOSPITAL_COMMUNITY): Payer: Self-pay

## 2021-08-19 ENCOUNTER — Other Ambulatory Visit (HOSPITAL_COMMUNITY): Payer: Self-pay

## 2021-08-20 ENCOUNTER — Other Ambulatory Visit (HOSPITAL_COMMUNITY): Payer: Self-pay

## 2021-08-20 MED ORDER — ZOLPIDEM TARTRATE 5 MG PO TABS
ORAL_TABLET | ORAL | 0 refills | Status: DC
  Filled 2021-08-20 – 2021-08-23 (×2): qty 90, 90d supply, fill #0

## 2021-08-23 ENCOUNTER — Other Ambulatory Visit (HOSPITAL_COMMUNITY): Payer: Self-pay

## 2021-08-24 ENCOUNTER — Other Ambulatory Visit (HOSPITAL_COMMUNITY): Payer: Self-pay

## 2021-09-15 IMAGING — MR MR ABDOMEN WO/W CM
19 series · 48 of 48 positions shown · IV contrast (multihance)
Comparison: Ultrasound of the abdomen February 19, 2021.

CLINICAL DATA: Abnormal ultrasound on February 19, 2021.

EXAM:
MRI ABDOMEN WITHOUT AND WITH CONTRAST
TECHNIQUE: Multiplanar multisequence MR imaging of the abdomen was performed
both before and after the administration of intravenous contrast.
CONTRAST:  20mL MULTIHANCE GADOBENATE DIMEGLUMINE 529 MG/ML IV SOLN

[Series 3: T2 · coronal · 5.0mm · 1.76mm/px · 2 of 41 slices shown (1 of 3)]
[im 1/41]
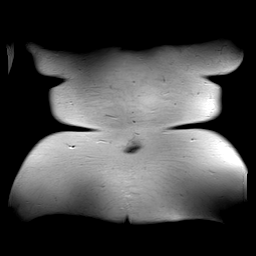
[im 41/41]
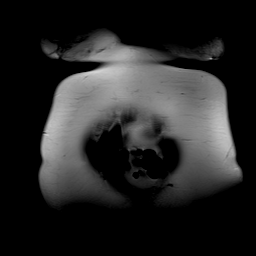

[Series 5: T1 · axial · 3.0mm · 1.31mm/px · z∈[-125,+112]mm · 6 of 160 slices shown (1 of 3)]
[im 1/160]
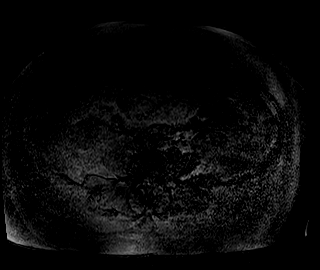
[im 32/160]
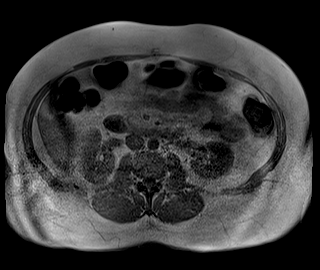
[im 64/160]
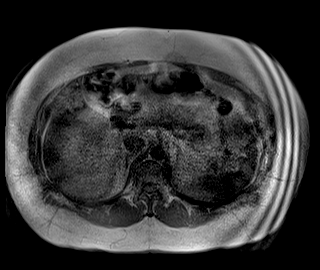
[im 96/160]
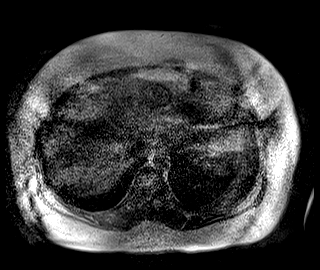
[im 128/160]
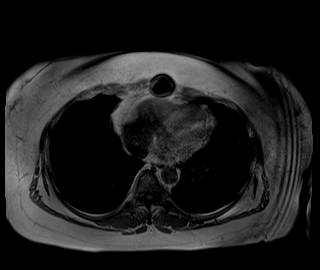
[im 160/160]
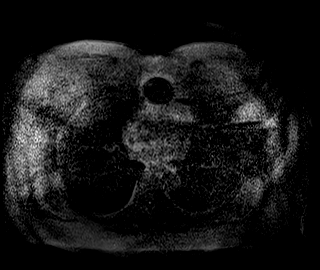

[Series 6: bSSFP · axial · 5.0mm · 1.41mm/px · z∈[-145,+107]mm · 2 of 43 slices shown]
[im 1/43]
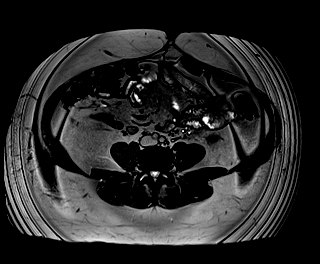
[im 43/43]
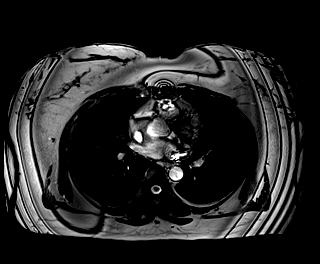

[Series 7: T1 · axial · 3.0mm · 1.47mm/px · z∈[-125,+112]mm · 6 of 160 slices shown (2 of 3)]
[im 1/160]
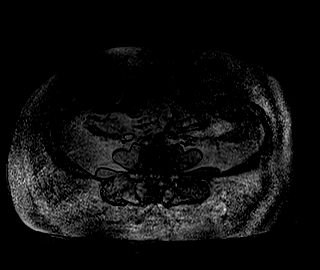
[im 32/160]
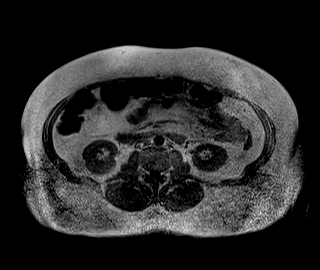
[im 64/160]
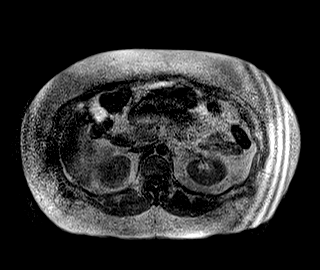
[im 96/160]
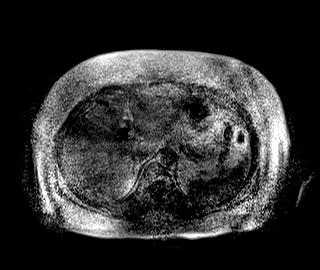
[im 128/160]
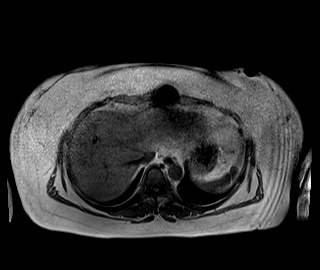
[im 160/160]
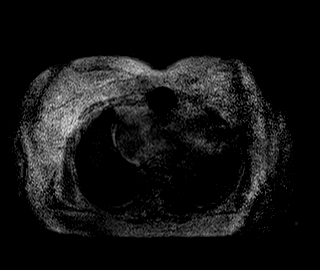

[Series 8: T2 · axial · 5.0mm · 1.76mm/px · 1 of 43 slices shown (2 of 3)]
[im 1/43]
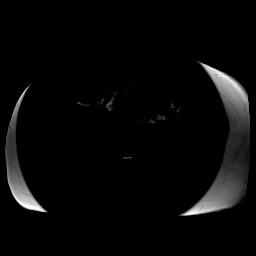

[Series 9: T1 · axial · 3.0mm · 1.76mm/px · z∈[-125,+112]mm · 5 of 160 slices shown (3 of 3)]
[im 1/160]
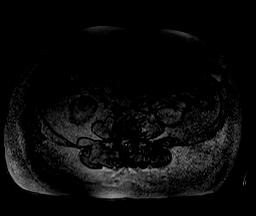
[im 40/160]
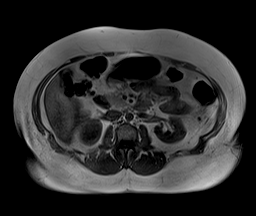
[im 80/160]
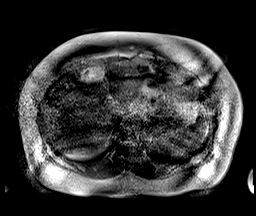
[im 120/160]
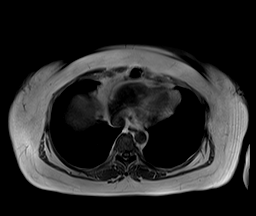
[im 160/160]
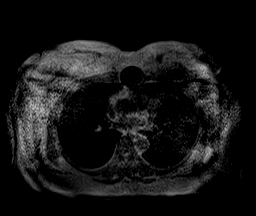

[Series 10: DWI · axial · 5.0mm · 1.68mm/px · z∈[-133,+119]mm · 4 of 129 slices shown (1 of 2)]
[im 1/129]
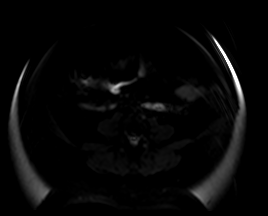
[im 43/129]
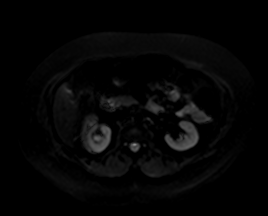
[im 86/129]
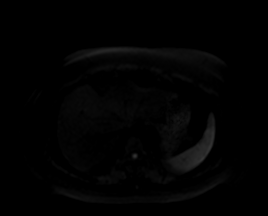
[im 129/129]
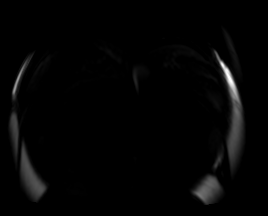

[Series 11: DWI · axial · 5.0mm · 1.68mm/px · 1 of 43 slices shown (2 of 2)]
[im 1/43]
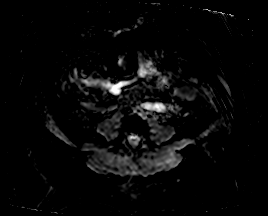

[Series 12: T2 · axial · 5.0mm · 1.76mm/px · 1 of 43 slices shown (3 of 3)]
[im 1/43]
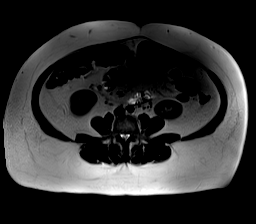

[Series 13: T1 dynamic · axial · non-contrast · 3.0mm · 1.41mm/px · z∈[-125,+112]mm · 2 of 80 slices shown]
[im 1/80]
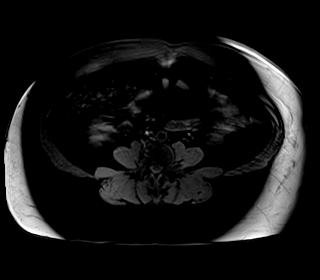
[im 80/80]
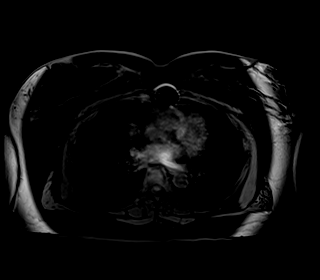

[Series 14: T1 dynamic post-contrast · axial · 3.0mm · 1.41mm/px · z∈[-125,+112]mm · 2 of 80 slices shown (1 of 9)]
[im 1/80]
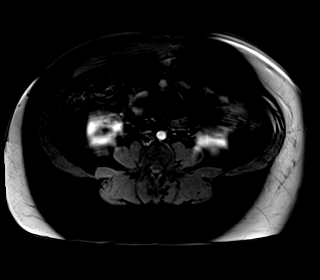
[im 80/80]
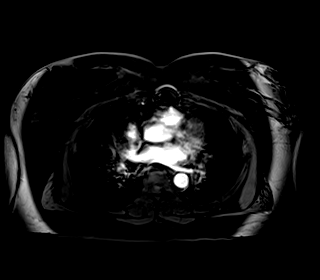

[Series 15: T1 dynamic post-contrast · axial · 3.0mm · 1.41mm/px · z∈[-125,+112]mm · 2 of 80 slices shown (2 of 9)]
[im 1/80]
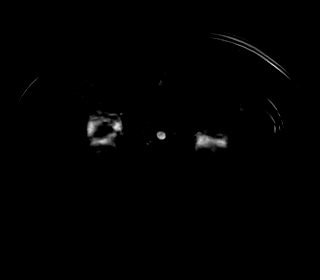
[im 80/80]
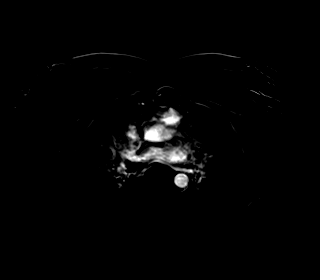

[Series 16: T1 dynamic post-contrast · axial · 3.0mm · 1.41mm/px · z∈[-125,+112]mm · 2 of 80 slices shown (3 of 9)]
[im 1/80]
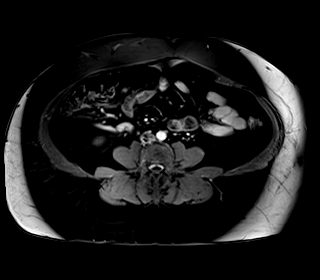
[im 80/80]
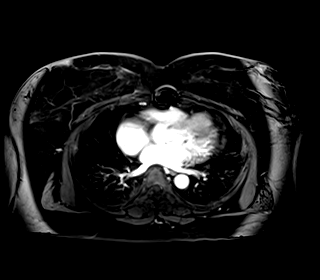

[Series 17: T1 dynamic post-contrast · axial · 3.0mm · 1.41mm/px · z∈[-125,+112]mm · 2 of 80 slices shown (4 of 9)]
[im 1/80]
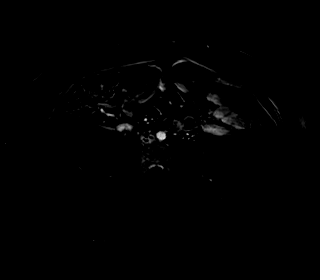
[im 80/80]
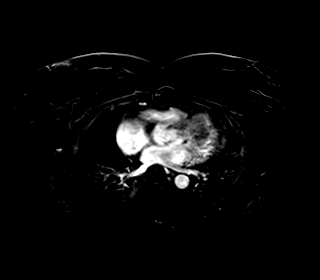

[Series 18: T1 dynamic post-contrast · axial · 3.0mm · 1.41mm/px · z∈[-125,+112]mm · 2 of 80 slices shown (5 of 9)]
[im 1/80]
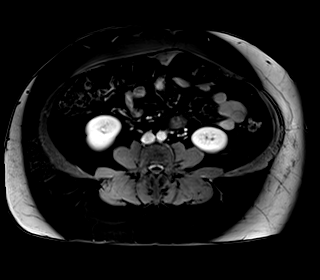
[im 80/80]
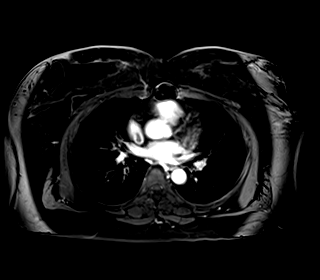

[Series 19: T1 dynamic post-contrast · axial · 3.0mm · 1.41mm/px · z∈[-125,+112]mm · 2 of 80 slices shown (6 of 9)]
[im 1/80]
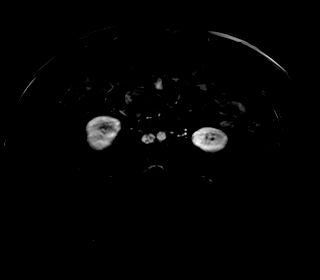
[im 80/80]
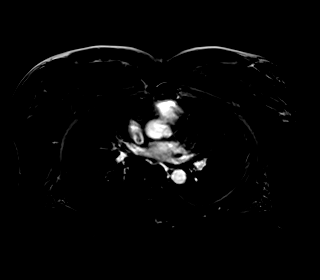

[Series 20: T1 dynamic post-contrast · coronal · 3.0mm · 1.41mm/px · 2 of 80 slices shown (7 of 9)]
[im 1/80]
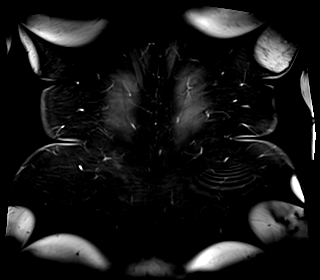
[im 80/80]
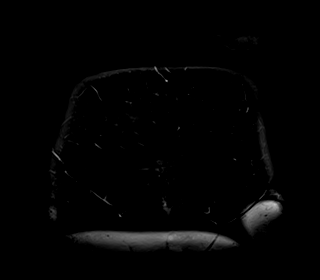

[Series 21: T1 dynamic post-contrast · axial · 3.0mm · 1.41mm/px · z∈[-125,+112]mm · 2 of 80 slices shown (8 of 9)]
[im 1/80]
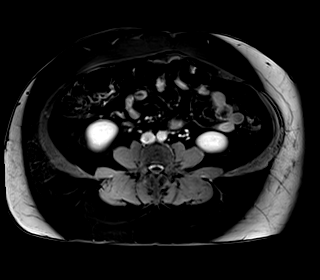
[im 80/80]
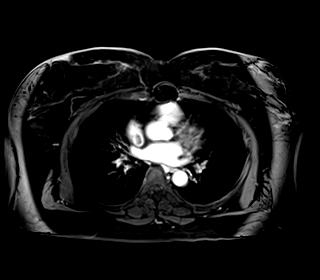

[Series 22: T1 dynamic post-contrast · axial · 3.0mm · 1.41mm/px · z∈[-125,+112]mm · 2 of 80 slices shown (9 of 9)]
[im 1/80]
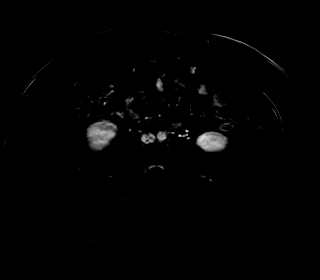
[im 80/80]
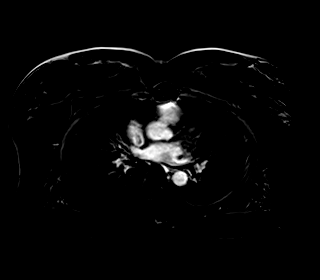

[48 of 48 positions shown; findings below may reference images not displayed]

FINDINGS: Lower chest: Incidental imaging of the lung bases remarkable for
changes of median sternotomy. No effusion or basilar consolidation
is on limited assessment. Is

Hepatobiliary: Hepatic steatosis, moderate. Post cholecystectomy
without biliary duct distension. Area of concern in the medial RIGHT
hepatic lobe measuring 1.7 cm represents a cyst. Smaller cysts are
seen elsewhere in the liver. No suspicious focal hepatic lesion.

Portal vein is patent.  Hepatic veins are patent.

Pancreas: Normal intrinsic T1 signal. No ductal dilation or sign of
inflammation. No focal lesion.

Spleen:  Normal spleen.

Adrenals/Urinary Tract:  Adrenal glands are normal.

Symmetric renal enhancement. No hydronephrosis or perinephric
stranding.

Stomach/Bowel: Limited imaging of the gastrointestinal tract is
unremarkable on abdominal MRI.

Vascular/Lymphatic: Normal caliber abdominal aorta. Smooth contour
of the IVC. There is no gastrohepatic or hepatoduodenal ligament
lymphadenopathy. No retroperitoneal or mesenteric lymphadenopathy.

Other:  None.

Musculoskeletal: No suspicious bone lesions identified.
IMPRESSION: 1. Area of concern in the medial RIGHT hepatic lobe represents a
cyst. Smaller cysts are seen elsewhere in the liver.
2. Hepatic steatosis, moderate.

## 2021-10-02 ENCOUNTER — Other Ambulatory Visit (HOSPITAL_COMMUNITY): Payer: Self-pay

## 2021-10-28 ENCOUNTER — Other Ambulatory Visit (HOSPITAL_COMMUNITY): Payer: Self-pay

## 2021-10-28 MED ORDER — TRAZODONE HCL 150 MG PO TABS
150.0000 mg | ORAL_TABLET | Freq: Every day | ORAL | 0 refills | Status: DC
  Filled 2021-10-28: qty 30, 30d supply, fill #0
  Filled 2021-11-30: qty 30, 30d supply, fill #1
  Filled 2022-01-03: qty 30, 30d supply, fill #2

## 2021-10-28 MED ORDER — LEVOTHYROXINE SODIUM 150 MCG PO TABS
ORAL_TABLET | ORAL | 5 refills | Status: AC
  Filled 2021-10-28: qty 30, 30d supply, fill #0
  Filled 2021-11-30: qty 30, 30d supply, fill #1
  Filled 2022-01-03: qty 30, 30d supply, fill #2
  Filled 2022-02-10: qty 30, 30d supply, fill #3
  Filled 2022-04-02: qty 30, 30d supply, fill #4

## 2021-10-29 ENCOUNTER — Other Ambulatory Visit (HOSPITAL_COMMUNITY): Payer: Self-pay

## 2021-11-23 ENCOUNTER — Other Ambulatory Visit (HOSPITAL_COMMUNITY): Payer: Self-pay

## 2021-11-23 MED ORDER — ZOLPIDEM TARTRATE 5 MG PO TABS
ORAL_TABLET | ORAL | 0 refills | Status: AC
  Filled 2021-11-23: qty 30, 30d supply, fill #0
  Filled 2021-11-30: qty 90, 90d supply, fill #0

## 2021-11-30 ENCOUNTER — Other Ambulatory Visit (HOSPITAL_COMMUNITY): Payer: Self-pay

## 2021-12-28 ENCOUNTER — Other Ambulatory Visit (HOSPITAL_COMMUNITY): Payer: Self-pay

## 2021-12-28 MED ORDER — AMOXICILLIN-POT CLAVULANATE 875-125 MG PO TABS
ORAL_TABLET | ORAL | 0 refills | Status: AC
  Filled 2021-12-28: qty 20, 10d supply, fill #0

## 2021-12-28 MED ORDER — BENZONATATE 200 MG PO CAPS
ORAL_CAPSULE | ORAL | 0 refills | Status: AC
  Filled 2021-12-28: qty 42, 14d supply, fill #0

## 2022-01-03 ENCOUNTER — Other Ambulatory Visit (HOSPITAL_COMMUNITY): Payer: Self-pay

## 2022-01-03 MED ORDER — LIOTHYRONINE SODIUM 5 MCG PO TABS
ORAL_TABLET | ORAL | 1 refills | Status: AC
  Filled 2022-01-03: qty 30, 30d supply, fill #0
  Filled 2022-02-10: qty 30, 30d supply, fill #1
  Filled 2022-04-02: qty 30, 30d supply, fill #2

## 2022-01-17 ENCOUNTER — Other Ambulatory Visit (HOSPITAL_COMMUNITY): Payer: Self-pay

## 2022-01-17 MED ORDER — POLYMYXIN B-TRIMETHOPRIM 10000-0.1 UNIT/ML-% OP SOLN
OPHTHALMIC | 0 refills | Status: AC
  Filled 2022-01-17: qty 10, 25d supply, fill #0

## 2022-01-20 ENCOUNTER — Other Ambulatory Visit (HOSPITAL_COMMUNITY): Payer: Self-pay

## 2022-01-20 MED ORDER — MELOXICAM 15 MG PO TABS
15.0000 mg | ORAL_TABLET | Freq: Every day | ORAL | 2 refills | Status: AC
  Filled 2022-01-20: qty 30, 30d supply, fill #0
  Filled 2022-04-11: qty 30, 30d supply, fill #1

## 2022-02-10 ENCOUNTER — Other Ambulatory Visit (HOSPITAL_COMMUNITY): Payer: Self-pay

## 2022-02-10 MED ORDER — TRAZODONE HCL 150 MG PO TABS
ORAL_TABLET | ORAL | 1 refills | Status: AC
  Filled 2022-02-10: qty 30, 30d supply, fill #0
  Filled 2022-03-11: qty 30, 30d supply, fill #1
  Filled 2022-04-11: qty 30, 30d supply, fill #2

## 2022-03-01 ENCOUNTER — Other Ambulatory Visit (HOSPITAL_COMMUNITY): Payer: Self-pay

## 2022-03-01 MED ORDER — ZOLPIDEM TARTRATE 5 MG PO TABS
ORAL_TABLET | ORAL | 0 refills | Status: AC
  Filled 2022-03-01: qty 90, 90d supply, fill #0

## 2022-03-11 ENCOUNTER — Other Ambulatory Visit (HOSPITAL_COMMUNITY): Payer: Self-pay

## 2022-04-02 ENCOUNTER — Other Ambulatory Visit (HOSPITAL_COMMUNITY): Payer: Self-pay

## 2022-04-11 ENCOUNTER — Other Ambulatory Visit (HOSPITAL_COMMUNITY): Payer: Self-pay

## 2022-05-20 ENCOUNTER — Other Ambulatory Visit (HOSPITAL_COMMUNITY): Payer: Self-pay

## 2022-05-21 ENCOUNTER — Other Ambulatory Visit (HOSPITAL_COMMUNITY): Payer: Self-pay
